# Patient Record
Sex: Female | Born: 1995 | Race: Black or African American | Hispanic: No | Marital: Single | Smoking: Current every day smoker
Health system: Southern US, Community
[De-identification: ages and names within clinical notes are randomized; demographics above are authoritative.]

## PROBLEM LIST (undated history)

## (undated) DIAGNOSIS — J45909 Unspecified asthma, uncomplicated: Secondary | ICD-10-CM

## (undated) DIAGNOSIS — M419 Scoliosis, unspecified: Secondary | ICD-10-CM

---
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
## 2013-02-10 ENCOUNTER — Emergency Department: Payer: Self-pay | Admitting: Emergency Medicine

## 2014-01-04 ENCOUNTER — Encounter: Payer: Self-pay | Admitting: Family Medicine

## 2014-01-21 ENCOUNTER — Encounter: Payer: Self-pay | Admitting: Family Medicine

## 2014-12-02 ENCOUNTER — Emergency Department: Payer: Self-pay | Admitting: Student

## 2015-11-07 ENCOUNTER — Emergency Department
Admission: EM | Admit: 2015-11-07 | Discharge: 2015-11-07 | Disposition: A | Discharge status: Home / Self Care | Payer: Self-pay | Attending: Emergency Medicine | Admitting: Emergency Medicine

## 2015-11-07 DIAGNOSIS — K5900 Constipation, unspecified: Secondary | ICD-10-CM | POA: Insufficient documentation

## 2015-11-07 DIAGNOSIS — Z3202 Encounter for pregnancy test, result negative: Secondary | ICD-10-CM | POA: Insufficient documentation

## 2015-11-07 DIAGNOSIS — R1084 Generalized abdominal pain: Secondary | ICD-10-CM

## 2015-11-07 LAB — URINALYSIS COMPLETE WITH MICROSCOPIC (ARMC ONLY)
Bilirubin Urine: NEGATIVE
Glucose, UA: NEGATIVE mg/dL
Hgb urine dipstick: NEGATIVE
KETONES UR: NEGATIVE mg/dL
NITRITE: NEGATIVE
PH: 7 (ref 5.0–8.0)
PROTEIN: NEGATIVE mg/dL
SPECIFIC GRAVITY, URINE: 1.019 (ref 1.005–1.030)

## 2015-11-07 LAB — BASIC METABOLIC PANEL
Anion gap: 6 (ref 5–15)
BUN: 10 mg/dL (ref 6–20)
CALCIUM: 9.5 mg/dL (ref 8.9–10.3)
CO2: 28 mmol/L (ref 22–32)
Chloride: 107 mmol/L (ref 101–111)
Creatinine, Ser: 0.7 mg/dL (ref 0.44–1.00)
GFR calc Af Amer: >60 mL/min (ref >60)
GLUCOSE: 76 mg/dL (ref 65–99)
Potassium: 3.3 mmol/L — ABNORMAL LOW (ref 3.5–5.1)
Sodium: 141 mmol/L (ref 135–145)

## 2015-11-07 LAB — CBC
HEMATOCRIT: 38.9 % (ref 35.0–47.0)
Hemoglobin: 13 g/dL (ref 12.0–16.0)
MCH: 29.6 pg (ref 26.0–34.0)
MCHC: 33.3 g/dL (ref 32.0–36.0)
MCV: 88.7 fL (ref 80.0–100.0)
PLATELETS: 346 10*3/uL (ref 150–440)
RBC: 4.39 MIL/uL (ref 3.80–5.20)
RDW: 15.2 % — AB (ref 11.5–14.5)
WBC: 6.9 10*3/uL (ref 3.6–11.0)

## 2015-11-07 LAB — HCG, QUANTITATIVE, PREGNANCY: hCG, Beta Chain, Quant, S: <1 m[IU]/mL (ref <5)

## 2015-11-07 NOTE — ED Provider Notes (Signed)
[facility] Emergency Department Provider Note  Time seen: 9:22 PM  I have reviewed the triage vital signs and the nursing notes.   HISTORY  Chief Complaint Back Pain    HPI Leslie Banks is a 20 y.o. female with no past medical history presents the emergency department with abdominal discomfort. According to the patient for the past 3 months she feels her abdomen has enlarged in size, she feels moving occasionally feels kicking in her abdomen. She states she has taken pregnancy tests at home which he been negative, but it feels like a pregnancy so she came to the emergency department for evaluation. Denies any "pain." They she does have discomfort in a sensation that something is kicking in her abdomen most days. Denies any dysuria. Denies any back pain. Denies any nausea, vomiting, diarrhea or fever.     No past medical history on file.  There are no active problems to display for this patient.   No past surgical history on file.  No current outpatient prescriptions on file.  Allergies Review of patient's allergies indicates no known allergies.  No family history on file.  Social History Social History  Substance Use Topics  . Smoking status: Not on file  . Smokeless tobacco: Not on file  . Alcohol Use: Not on file    Review of Systems Constitutional: Negative for fever. Cardiovascular: Negative for chest pain. Respiratory: Negative for shortness of breath. Gastrointestinal: Negative for abdominal pain Genitourinary: Negative for dysuria. Neurological: Negative for headache 10-point ROS otherwise negative.  ____________________________________________   PHYSICAL EXAM:  VITAL SIGNS: ED Triage Vitals  Enc Vitals Group     BP 11/07/15 2025 106/64 mmHg     Pulse Rate 11/07/15 2025 98     Resp 11/07/15 2025 18     Temp 11/07/15 2025 98.4 F (36.9 C)     Temp Source 11/07/15 2025 Oral     SpO2 11/07/15 2025 100 %     Weight  11/07/15 2025 130 lb (58.968 kg)     Height --      Head Cir --      Peak Flow --      Pain Score 11/07/15 2025 0     Pain Loc --      Pain Edu? --      Excl. in GC? --     Constitutional: Alert and oriented. Well appearing and in no distress. Eyes: Normal exam ENT   Head: Normocephalic and atraumatic.   Mouth/Throat: Mucous membranes are moist. Cardiovascular: Normal rate, regular rhythm. No murmur Respiratory: Normal respiratory effort without tachypnea nor retractions. Breath sounds are clear Gastrointestinal: Soft, minimal left lower quadrant tenderness palpation. No rebound or guarding. No distention. Musculoskeletal: Nontender with normal range of motion in all extremities.  Neurologic:  Normal speech and language. No gross focal neurologic deficits Skin:  Skin is warm, dry and intact.  Psychiatric: Mood and affect are normal. Speech and behavior are normal.  ____________________________________________   INITIAL IMPRESSION / ASSESSMENT AND PLAN / ED COURSE  Pertinent labs & imaging results that were available during my care of the patient were reviewed by me and considered in my medical decision making (see chart for details).  Patient appears very well, abdomen appears normal, minimal left lower quadrant tenderness to palpation. No rebound or guarding. No CVA tenderness. No back tenderness. Bedside ultrasound performed by myself appears to show a normal uterus, no signs of pregnancy. Normal aorta. Normal gallbladder. No free fluid. We  will check labs including a quantitative beta hCG to confirm. Patient is in no distress and appears very well overall.  Labs are normal including negative beta hCG. We'll discharge the patient. She does state she feels somewhat constipated, this could be the cause of the patient's discomfort.  ____________________________________________   FINAL CLINICAL IMPRESSION(S) / ED DIAGNOSES  Abdominal discomfort   Minna Antis,  MD 11/07/15 2231

## 2015-11-07 NOTE — ED Notes (Signed)
FHT negative

## 2015-11-07 NOTE — ED Notes (Signed)
Pt here because abd is distended and unsure of pregnancy, no other complaints

## 2015-11-07 NOTE — ED Notes (Signed)
MD at bedside. 

## 2015-11-07 NOTE — Discharge Instructions (Signed)

## 2015-11-07 NOTE — ED Notes (Signed)
Pt states x64months she has been feeling abd "moving and thumps". Denies any positive preg test. Pt also states she is having back spasms . MD at bedside with ultrasound

## 2016-01-13 ENCOUNTER — Emergency Department
Admission: EM | Admit: 2016-01-13 | Discharge: 2016-01-13 | Disposition: A | Discharge status: Home / Self Care | Payer: Self-pay | Attending: Emergency Medicine | Admitting: Emergency Medicine

## 2016-01-13 ENCOUNTER — Encounter: Payer: Self-pay | Admitting: Emergency Medicine

## 2016-01-13 DIAGNOSIS — F17210 Nicotine dependence, cigarettes, uncomplicated: Secondary | ICD-10-CM | POA: Insufficient documentation

## 2016-01-13 DIAGNOSIS — Y929 Unspecified place or not applicable: Secondary | ICD-10-CM | POA: Insufficient documentation

## 2016-01-13 DIAGNOSIS — W268XXA Contact with other sharp object(s), not elsewhere classified, initial encounter: Secondary | ICD-10-CM | POA: Insufficient documentation

## 2016-01-13 DIAGNOSIS — Y93G3 Activity, cooking and baking: Secondary | ICD-10-CM | POA: Insufficient documentation

## 2016-01-13 DIAGNOSIS — S61411A Laceration without foreign body of right hand, initial encounter: Secondary | ICD-10-CM | POA: Insufficient documentation

## 2016-01-13 DIAGNOSIS — Y999 Unspecified external cause status: Secondary | ICD-10-CM | POA: Insufficient documentation

## 2016-01-13 DIAGNOSIS — J45909 Unspecified asthma, uncomplicated: Secondary | ICD-10-CM | POA: Insufficient documentation

## 2016-01-13 HISTORY — DX: Scoliosis, unspecified: M41.9

## 2016-01-13 HISTORY — DX: Unspecified asthma, uncomplicated: J45.909

## 2016-01-13 MED ORDER — IBUPROFEN 800 MG PO TABS
800.0000 mg | ORAL_TABLET | Freq: Once | ORAL | Status: AC
  Administered 2016-01-13: 800 mg via ORAL

## 2016-01-13 MED ORDER — TETANUS-DIPHTH-ACELL PERTUSSIS 5-2.5-18.5 LF-MCG/0.5 IM SUSP
0.5000 mL | Freq: Once | INTRAMUSCULAR | Status: AC
  Administered 2016-01-13: 0.5 mL via INTRAMUSCULAR
  Filled 2016-01-13: qty 0.5

## 2016-01-13 MED ORDER — LIDOCAINE HCL (PF) 1 % IJ SOLN
2.0000 mL | Freq: Once | INTRAMUSCULAR | Status: AC
  Administered 2016-01-13: 2 mL
  Filled 2016-01-13: qty 5

## 2016-01-13 MED ORDER — IBUPROFEN 800 MG PO TABS
ORAL_TABLET | ORAL | Status: AC
Start: 2016-01-13 — End: 2016-01-13
  Administered 2016-01-13: 800 mg via ORAL
  Filled 2016-01-13: qty 1

## 2016-01-13 NOTE — ED Provider Notes (Signed)
CSN: [id number]     Arrival date & time 01/13/16  1957 History   First MD Initiated Contact with Patient 01/13/16 2024     Chief Complaint  Patient presents with  . Extremity Laceration     (Consider location/radiation/quality/duration/timing/severity/associated sxs/prior Treatment) HPI  20 year old female presents today for laceration to the right thumb along the thenar eminence. Patient states she was cooking, cut her right hand on a can. Tetanus is not up-to-date. Injury occurred at approximate 5 PM tonight. She denies any numbness or tingling. Bleeding is well controlled. Pain is moderate. She has not taken any medications for pain.  Past Medical History  Diagnosis Date  . Asthma   . Scoliosis    History reviewed. No pertinent past surgical history. History reviewed. No pertinent family history. Social History  Substance Use Topics  . Smoking status: Current Every Day Smoker    Types: Cigarettes  . Smokeless tobacco: None  . Alcohol Use: No   OB History    No data available     Review of Systems  Constitutional: Negative for fever, chills, activity change and fatigue.  HENT: Negative for congestion, sinus pressure and sore throat.   Eyes: Negative for visual disturbance.  Respiratory: Negative for cough, chest tightness and shortness of breath.   Cardiovascular: Negative for chest pain and leg swelling.  Gastrointestinal: Negative for nausea, vomiting, abdominal pain and diarrhea.  Genitourinary: Negative for dysuria.  Musculoskeletal: Negative for arthralgias and gait problem.  Skin: Positive for wound. Negative for rash.  Neurological: Negative for weakness, numbness and headaches.  Hematological: Negative for adenopathy.  Psychiatric/Behavioral: Negative for behavioral problems, confusion and agitation.      Allergies  Review of patient's allergies indicates no known allergies.  Home Medications   Prior to Admission medications   Not on File   BP 109/67  mmHg  Pulse 101  Temp(Src) 99 F (37.2 C) (Oral)  Resp 18  SpO2 98%  LMP 09/14/2015 (Approximate) Physical Exam  Constitutional: She is oriented to person, place, and time. She appears well-developed and well-nourished. No distress.  HENT:  Head: Normocephalic and atraumatic.  Mouth/Throat: Oropharynx is clear and moist.  Eyes: EOM are normal. Pupils are equal, round, and reactive to light. Right eye exhibits no discharge. Left eye exhibits no discharge.  Neck: Normal range of motion. Neck supple.  Cardiovascular: Normal rate, regular rhythm and intact distal pulses.   Pulmonary/Chest: Effort normal. No respiratory distress.  Musculoskeletal: Normal range of motion. She exhibits no edema.  Examination of the right hand shows patient has full composite fist. There is mild superficial to similar laceration along the thenar eminence. No numbness or tingling. Sensation is intact. Wound appears to be clean  Neurological: She is alert and oriented to person, place, and time. She has normal reflexes.  Skin: Skin is warm and dry.  Psychiatric: She has a normal mood and affect. Her behavior is normal. Thought content normal.    ED Course  Procedures (including critical care time) LACERATION REPAIR Performed by: Patience Musca Authorized by: Patience Musca Consent: Verbal consent obtained. Risks and benefits: risks, benefits and alternatives were discussed Consent given by: patient Patient identity confirmed: provided demographic data Prepped and Draped in normal sterile fashion Wound explored  Laceration Location: Right hand  Laceration Length: 2 cm  No Foreign Bodies seen or palpated  Anesthesia: local infiltration  Local anesthetic: lidocaine 1% % without epinephrine  Anesthetic total: 2 ml  Irrigation method: syringe Amount of cleaning:  standard  Skin closure: Simple interrupted 4-0 nylon   Number of sutures: 3   Technique: Simple interrupted    Patient tolerance: Patient tolerated the procedure well with no immediate complications. Labs Review Labs Reviewed - No data to display  Imaging Review No results found. I have personally reviewed and evaluated these images and lab results as part of my medical decision-making.   EKG Interpretation None      MDM   Final diagnoses:  Hand laceration, right, initial encounter    20 year old female with right hand laceration. Lacerations linear and clean. Laceration was approximated with 3 4-0 nylon sutures. Tetanus is updated. Patient will follow-up in 8-10 days for suture removal.    Evon Slackhomas C Dylann Layne, PA-C 01/13/16 2049  Phineas SemenGraydon Goodman, MD 01/13/16 2236

## 2016-01-13 NOTE — Discharge Instructions (Signed)
Laceration Care, Adult  A laceration is a cut that goes through all layers of the skin. The cut also goes into the tissue that is right under the skin. Some cuts heal on their own. Others need to be closed with stitches (sutures), staples, skin adhesive strips, or wound glue. Taking care of your cut lowers your risk of infection and helps your cut to heal better.  HOW TO TAKE CARE OF YOUR CUT  For stitches or staples:  · Keep the wound clean and dry.  · If you were given a bandage (dressing), you should change it at least one time per day or as told by your doctor. You should also change it if it gets wet or dirty.  · Keep the wound completely dry for the first 24 hours or as told by your doctor. After that time, you may take a shower or a bath. However, make sure that the wound is not soaked in water until after the stitches or staples have been removed.  · Clean the wound one time each day or as told by your doctor:    Wash the wound with soap and water.    Rinse the wound with water until all of the soap comes off.    Pat the wound dry with a clean towel. Do not rub the wound.  · After you clean the wound, put a thin layer of antibiotic ointment on it as told by your doctor. This ointment:    Helps to prevent infection.    Keeps the bandage from sticking to the wound.  · Have your stitches or staples removed as told by your doctor.  If your doctor used skin adhesive strips:   · Keep the wound clean and dry.  · If you were given a bandage, you should change it at least one time per day or as told by your doctor. You should also change it if it gets dirty or wet.  · Do not get the skin adhesive strips wet. You can take a shower or a bath, but be careful to keep the wound dry.  · If the wound gets wet, pat it dry with a clean towel. Do not rub the wound.  · Skin adhesive strips fall off on their own. You can trim the strips as the wound heals. Do not remove any strips that are still stuck to the wound. They will  fall off after a while.  If your doctor used wound glue:  · Try to keep your wound dry, but you may briefly wet it in the shower or bath. Do not soak the wound in water, such as by swimming.  · After you take a shower or a bath, gently pat the wound dry with a clean towel. Do not rub the wound.  · Do not do any activities that will make you really sweaty until the skin glue has fallen off on its own.  · Do not apply liquid, cream, or ointment medicine to your wound while the skin glue is still on.  · If you were given a bandage, you should change it at least one time per day or as told by your doctor. You should also change it if it gets dirty or wet.  · If a bandage is placed over the wound, do not let the tape for the bandage touch the skin glue.  · Do not pick at the glue. The skin glue usually stays on for 5-10 days. Then, it   falls off of the skin.  General Instructions   · To help prevent scarring, make sure to cover your wound with sunscreen whenever you are outside after stitches are removed, after adhesive strips are removed, or when wound glue stays in place and the wound is healed. Make sure to wear a sunscreen of at least 30 SPF.  · Take over-the-counter and prescription medicines only as told by your doctor.  · If you were given antibiotic medicine or ointment, take or apply it as told by your doctor. Do not stop using the antibiotic even if your wound is getting better.  · Do not scratch or pick at the wound.  · Keep all follow-up visits as told by your doctor. This is important.  · Check your wound every day for signs of infection. Watch for:    Redness, swelling, or pain.    Fluid, blood, or pus.  · Raise (elevate) the injured area above the level of your heart while you are sitting or lying down, if possible.  GET HELP IF:  · You got a tetanus shot and you have any of these problems at the injection site:    Swelling.    Very bad pain.    Redness.    Bleeding.  · You have a fever.  · A wound that was  closed breaks open.  · You notice a bad smell coming from your wound or your bandage.  · You notice something coming out of the wound, such as wood or glass.  · Medicine does not help your pain.  · You have more redness, swelling, or pain at the site of your wound.  · You have fluid, blood, or pus coming from your wound.  · You notice a change in the color of your skin near your wound.  · You need to change the bandage often because fluid, blood, or pus is coming from the wound.  · You start to have a new rash.  · You start to have numbness around the wound.  GET HELP RIGHT AWAY IF:  · You have very bad swelling around the wound.  · Your pain suddenly gets worse and is very bad.  · You notice painful lumps near the wound or on skin that is anywhere on your body.  · You have a red streak going away from your wound.  · The wound is on your hand or foot and you cannot move a finger or toe like you usually can.  · The wound is on your hand or foot and you notice that your fingers or toes look pale or bluish.     This information is not intended to replace advice given to you by your health care provider. Make sure you discuss any questions you have with your health care provider.     Document Released: 02/26/2008 Document Revised: 01/24/2015 Document Reviewed: 09/05/2014  Elsevier Interactive Patient Education ©2016 [employer]

## 2016-01-13 NOTE — ED Notes (Signed)
Pt to ED via POV from home c/o right thumb laceration that happened around 1700 today.  Pt states cut thumb on a can while cooking.  Bleeding under control at this time.  Laceration approx 1 in.

## 2016-04-21 ENCOUNTER — Emergency Department
Admission: EM | Admit: 2016-04-21 | Discharge: 2016-04-21 | Disposition: A | Discharge status: Home / Self Care | Payer: Self-pay | Attending: Emergency Medicine | Admitting: Emergency Medicine

## 2016-04-21 ENCOUNTER — Encounter: Payer: Self-pay | Admitting: Emergency Medicine

## 2016-04-21 ENCOUNTER — Emergency Department: Payer: Self-pay

## 2016-04-21 DIAGNOSIS — F17210 Nicotine dependence, cigarettes, uncomplicated: Secondary | ICD-10-CM | POA: Insufficient documentation

## 2016-04-21 DIAGNOSIS — J40 Bronchitis, not specified as acute or chronic: Secondary | ICD-10-CM | POA: Insufficient documentation

## 2016-04-21 DIAGNOSIS — Z8709 Personal history of other diseases of the respiratory system: Secondary | ICD-10-CM

## 2016-04-21 DIAGNOSIS — J069 Acute upper respiratory infection, unspecified: Secondary | ICD-10-CM | POA: Insufficient documentation

## 2016-04-21 LAB — POCT RAPID STREP A: STREPTOCOCCUS, GROUP A SCREEN (DIRECT): NEGATIVE

## 2016-04-21 MED ORDER — AZITHROMYCIN 250 MG PO TABS: ORAL_TABLET | ORAL | 0 refills | Status: DC

## 2016-04-21 MED ORDER — PSEUDOEPH-BROMPHEN-DM 30-2-10 MG/5ML PO SYRP: 10.0000 mL | ORAL_SOLUTION | Freq: Four times a day (QID) | ORAL | 0 refills | Status: DC | PRN

## 2016-04-21 MED ORDER — ALBUTEROL SULFATE (2.5 MG/3ML) 0.083% IN NEBU
2.5000 mg | INHALATION_SOLUTION | Freq: Once | RESPIRATORY_TRACT | Status: AC
  Administered 2016-04-21: 2.5 mg via RESPIRATORY_TRACT
  Filled 2016-04-21: qty 3

## 2016-04-21 MED ORDER — ALBUTEROL SULFATE HFA 108 (90 BASE) MCG/ACT IN AERS: 1.0000 | INHALATION_SPRAY | Freq: Four times a day (QID) | RESPIRATORY_TRACT | 0 refills | Status: AC | PRN

## 2016-04-21 NOTE — ED Provider Notes (Signed)
[facility] Emergency Department Provider Note  ____________________________________________  Time seen: Approximately 11:28 AM  I have reviewed the triage vital signs and the nursing notes.   HISTORY  Chief Complaint Cough and Sore Throat    HPI Leslie Banks is a 20 y.o. female , NAD, presents to the emergency department with 1 week history of cough and sore throat. Patient states had mild sore throat approximately one week ago that has continued to persist over the last week. Notes cough with chest congestion worsening over the last few days. Does have a history of asthma but does not have a rescue inhaler to use. Also notes nasal congestion, ear pressure, sinus pressure. Has had fevers and chills with MAXIMUM TEMPERATURE of 44F yesterday. Has not been taking anything over-the-counter for current symptoms. Denies any sick contacts. Has not had any chest pain, wheezing but does note shortness of breath with cough. Has been eating and drinking well. Has not had any abdominal pain, nausea, vomiting, diarrhea.   Past Medical History:  Diagnosis Date  . Asthma   . Scoliosis     There are no active problems to display for this patient.   History reviewed. No pertinent surgical history.  Prior to Admission medications   Medication Sig Start Date End Date Taking? Authorizing Provider  albuterol (PROVENTIL HFA;VENTOLIN HFA) 108 (90 Base) MCG/ACT inhaler Inhale 1-2 puffs into the lungs every 6 (six) hours as needed for wheezing or shortness of breath. 04/21/16   Jami L Hagler, PA-C  azithromycin (ZITHROMAX Z-PAK) 250 MG tablet Take 2 tablets (500 mg) on  Day 1,  followed by 1 tablet (250 mg) once daily on Days 2 through 5. 04/21/16   Jami L Hagler, PA-C  brompheniramine-pseudoephedrine-DM 30-2-10 MG/5ML syrup Take 10 mLs by mouth 4 (four) times daily as needed. 04/21/16   Jami L Hagler, PA-C    Allergies Review of patient's allergies indicates no known  allergies.  History reviewed. No pertinent family history.  Social History Social History  Substance Use Topics  . Smoking status: Current Every Day Smoker    Types: Cigarettes  . Smokeless tobacco: Never Used  . Alcohol use No     Review of Systems  Constitutional: Positive fever, chills, fatigue. Eyes: No visual changes. No discharge, Redness, pain ENT: Positive sore throat, nasal congestion, sinus pressure, ear pressure. Cardiovascular: No chest pain, palpitations. Respiratory: Positive chest congestion, cough with shortness of breath. No wheezing.  Gastrointestinal: No abdominal pain.  No nausea, vomiting.   Musculoskeletal: Positive general myalgias. Negative for back, neck pain.  Skin: Negative for rash. Neurological: Negative for headaches, focal weakness or numbness. 10-point ROS otherwise negative.  ____________________________________________   PHYSICAL EXAM:  VITAL SIGNS: ED Triage Vitals  Enc Vitals Group     BP 04/21/16 1109 (!) 99/53     Pulse Rate 04/21/16 1109 (!) 108     Resp 04/21/16 1109 18     Temp 04/21/16 1109 98.5 F (36.9 C)     Temp Source 04/21/16 1109 Oral     SpO2 04/21/16 1109 100 %     Weight 04/21/16 1109 136 lb (61.7 kg)     Height 04/21/16 1109 5\' 4"  (1.626 m)     Head Circumference --      Peak Flow --      Pain Score 04/21/16 1110 8     Pain Loc --      Pain Edu? --      Excl. in GC? --  Constitutional: Alert and oriented. Ill appearing but in no acute distress. Eyes: Conjunctivae are normal without injection or icterus Head: Atraumatic. ENT:      Ears: TMs visualized bilaterally without effusion, bulging, perforation, erythema.      Nose: Moderate congestion with moderate clear rhinnorhea. Bilateral turbinates with injection.      Mouth/Throat: Mucous membranes are moist. Bilateral tonsils with moderate injection, mild swelling but no exudate. Posterior pharynx without erythema, swelling, exudate. Uvula is  midline. Neck: Supple with full range of motion. Trachea midline Hematological/Lymphatic/Immunilogical: No cervical lymphadenopathy. Cardiovascular: Normal rate, regular rhythm. Normal S1 and S2.  No murmurs, rubs, gallops Good peripheral circulation. Respiratory: Normal respiratory effort without tachypnea or retractions. Lungs CTAB with breath sounds noted in all lung fields. No wheeze, rhonchi, rales. Neurologic:  Normal speech and language. No gross focal neurologic deficits are appreciated.  Skin:  Skin is warm, dry and intact. No rash noted. Psychiatric: Mood and affect are normal. Speech and behavior are normal. Patient exhibits appropriate insight and judgement.   ____________________________________________   LABS (all labs ordered are listed, but only abnormal results are displayed)  Labs Reviewed  POCT RAPID STREP A   ____________________________________________  EKG  None ____________________________________________  RADIOLOGY I have personally viewed and evaluated these images (plain radiographs) as part of my medical decision making, as well as reviewing the written report by the radiologist.  Dg Chest 2 View  Result Date: 04/21/2016 CLINICAL DATA:  Cough for 1 week, fever. EXAM: CHEST  2 VIEW COMPARISON:  02/11/2013 FINDINGS: The heart size and mediastinal contours are within normal limits. Both lungs are clear. The visualized skeletal structures are unremarkable. IMPRESSION: No active cardiopulmonary disease. Electronically Signed   By: Charlett Nose M.D.   On: 04/21/2016 12:02   ____________________________________________    PROCEDURES  Procedure(s) performed: None   Procedures   Medications  albuterol (PROVENTIL) (2.5 MG/3ML) 0.083% nebulizer solution 2.5 mg (2.5 mg Nebulization Given 04/21/16 1203)   ----------------------------------------- 12:33 PM on 04/21/2016 -----------------------------------------  Patient notes significant improvement of  chest congestion since being given albuterol nebulized solution.  ____________________________________________   INITIAL IMPRESSION / ASSESSMENT AND PLAN / ED COURSE  Pertinent labs & imaging results that were available during my care of the patient were reviewed by me and considered in my medical decision making (see chart for details).  Clinical Course    Patient's diagnosis is consistent with bronchitis, upper respiratory infection with history of asthma. Patient will be discharged home with prescriptions for Zithromax, albuterol and Bromfed-DM cough syrup to take as directed. Patient advised to take over-the-counter Tylenol or ibuprofen as needed for fever or aches. Patient is to follow up with her primary care provider at [facility] if symptoms persist past this treatment course. Patient is given ED precautions to return to the ED for any worsening or new symptoms.    ____________________________________________  FINAL CLINICAL IMPRESSION(S) / ED DIAGNOSES  Final diagnoses:  Bronchitis  URI (upper respiratory infection)  History of asthma      NEW MEDICATIONS STARTED DURING THIS VISIT:  Discharge Medication List as of 04/21/2016 12:35 PM    START taking these medications   Details  albuterol (PROVENTIL HFA;VENTOLIN HFA) 108 (90 Base) MCG/ACT inhaler Inhale 1-2 puffs into the lungs every 6 (six) hours as needed for wheezing or shortness of breath., Starting Sun 04/21/2016, Print    azithromycin (ZITHROMAX Z-PAK) 250 MG tablet Take 2 tablets (500 mg) on  Day 1,  followed by 1  tablet (250 mg) once daily on Days 2 through 5., Print    brompheniramine-pseudoephedrine-DM 30-2-10 MG/5ML syrup Take 10 mLs by mouth 4 (four) times daily as needed., Starting Sun 04/21/2016, Print             Hope Pigeon, PA-C 04/21/16 1248    Minna Antis, MD 04/21/16 [phone]

## 2016-04-21 NOTE — ED Triage Notes (Signed)
Pt presents to ED with c/o "I don't feel good". Pt states fever of 99.8 at home, states cough and sore throat x 1 week.

## 2016-04-21 NOTE — ED Notes (Signed)
See triage note  States she "doesn't "feel good  Weakness, sore throat and low grade fever yesterday  Afebrile on arrival   Also has had cough for about 1 week

## 2016-04-21 NOTE — ED Notes (Signed)
Pt informed to return if any life threatening symptoms occur.  

## 2016-07-01 ENCOUNTER — Emergency Department
Admission: EM | Admit: 2016-07-01 | Discharge: 2016-07-01 | Disposition: A | Discharge status: Home / Self Care | Payer: Self-pay | Attending: Emergency Medicine | Admitting: Emergency Medicine

## 2016-07-01 DIAGNOSIS — O209 Hemorrhage in early pregnancy, unspecified: Secondary | ICD-10-CM | POA: Insufficient documentation

## 2016-07-01 DIAGNOSIS — J45909 Unspecified asthma, uncomplicated: Secondary | ICD-10-CM | POA: Insufficient documentation

## 2016-07-01 DIAGNOSIS — F17210 Nicotine dependence, cigarettes, uncomplicated: Secondary | ICD-10-CM | POA: Insufficient documentation

## 2016-07-01 DIAGNOSIS — Z5321 Procedure and treatment not carried out due to patient leaving prior to being seen by health care provider: Secondary | ICD-10-CM | POA: Insufficient documentation

## 2016-07-01 DIAGNOSIS — O99330 Smoking (tobacco) complicating pregnancy, unspecified trimester: Secondary | ICD-10-CM | POA: Insufficient documentation

## 2016-07-01 LAB — HCG, QUANTITATIVE, PREGNANCY

## 2016-07-01 LAB — ABO/RH: ABO/RH(D): B POS

## 2016-07-01 NOTE — ED Triage Notes (Signed)
Abdominal cramping and vaginal bleeding. Positive pregnancy test last month. No prenatal care established.

## 2016-07-01 NOTE — ED Notes (Signed)
Pt called from waiting room x 3 with no response.

## 2016-07-03 ENCOUNTER — Encounter: Payer: Self-pay | Admitting: Emergency Medicine

## 2016-07-03 ENCOUNTER — Emergency Department
Admission: EM | Admit: 2016-07-03 | Discharge: 2016-07-03 | Disposition: A | Discharge status: Home / Self Care | Payer: Self-pay | Attending: Emergency Medicine | Admitting: Emergency Medicine

## 2016-07-03 ENCOUNTER — Emergency Department: Payer: Self-pay

## 2016-07-03 DIAGNOSIS — Z79899 Other long term (current) drug therapy: Secondary | ICD-10-CM | POA: Insufficient documentation

## 2016-07-03 DIAGNOSIS — J45909 Unspecified asthma, uncomplicated: Secondary | ICD-10-CM | POA: Insufficient documentation

## 2016-07-03 DIAGNOSIS — A599 Trichomoniasis, unspecified: Secondary | ICD-10-CM

## 2016-07-03 DIAGNOSIS — R102 Pelvic and perineal pain unspecified side: Secondary | ICD-10-CM

## 2016-07-03 DIAGNOSIS — N83209 Unspecified ovarian cyst, unspecified side: Secondary | ICD-10-CM

## 2016-07-03 DIAGNOSIS — N83201 Unspecified ovarian cyst, right side: Secondary | ICD-10-CM | POA: Insufficient documentation

## 2016-07-03 DIAGNOSIS — F17210 Nicotine dependence, cigarettes, uncomplicated: Secondary | ICD-10-CM | POA: Insufficient documentation

## 2016-07-03 DIAGNOSIS — N739 Female pelvic inflammatory disease, unspecified: Secondary | ICD-10-CM

## 2016-07-03 DIAGNOSIS — Z91010 Allergy to peanuts: Secondary | ICD-10-CM | POA: Insufficient documentation

## 2016-07-03 LAB — LIPASE, BLOOD: Lipase: 17 U/L (ref 11–51)

## 2016-07-03 LAB — URINALYSIS COMPLETE WITH MICROSCOPIC (ARMC ONLY)
BACTERIA UA: NONE SEEN
Bilirubin Urine: NEGATIVE
GLUCOSE, UA: NEGATIVE mg/dL
Ketones, ur: NEGATIVE mg/dL
Leukocytes, UA: NEGATIVE
Nitrite: NEGATIVE
Protein, ur: NEGATIVE mg/dL
Specific Gravity, Urine: 1.008 (ref 1.005–1.030)
pH: 8 (ref 5.0–8.0)

## 2016-07-03 LAB — CBC
HEMATOCRIT: 37.3 % (ref 35.0–47.0)
Hemoglobin: 12.3 g/dL (ref 12.0–16.0)
MCH: 28.5 pg (ref 26.0–34.0)
MCHC: 33 g/dL (ref 32.0–36.0)
MCV: 86.4 fL (ref 80.0–100.0)
Platelets: 312 10*3/uL (ref 150–440)
RBC: 4.32 MIL/uL (ref 3.80–5.20)
RDW: 16.1 % — AB (ref 11.5–14.5)
WBC: 4.9 10*3/uL (ref 3.6–11.0)

## 2016-07-03 LAB — CHLAMYDIA/NGC RT PCR (ARMC ONLY)
Chlamydia Tr: NOT DETECTED
N gonorrhoeae: NOT DETECTED

## 2016-07-03 LAB — WET PREP, GENITAL
SPERM: NONE SEEN
YEAST WET PREP: NONE SEEN

## 2016-07-03 LAB — HCG, QUANTITATIVE, PREGNANCY: hCG, Beta Chain, Quant, S: <1 m[IU]/mL (ref <5)

## 2016-07-03 LAB — COMPREHENSIVE METABOLIC PANEL
ALBUMIN: 3.9 g/dL (ref 3.5–5.0)
ALT: 13 U/L — AB (ref 14–54)
AST: 23 U/L (ref 15–41)
Alkaline Phosphatase: 48 U/L (ref 38–126)
Anion gap: 6 (ref 5–15)
BILIRUBIN TOTAL: 0.2 mg/dL — AB (ref 0.3–1.2)
BUN: 5 mg/dL — AB (ref 6–20)
CO2: 24 mmol/L (ref 22–32)
CREATININE: 0.73 mg/dL (ref 0.44–1.00)
Calcium: 9.4 mg/dL (ref 8.9–10.3)
Chloride: 108 mmol/L (ref 101–111)
GFR calc Af Amer: >60 mL/min (ref >60)
GFR calc non Af Amer: >60 mL/min (ref >60)
GLUCOSE: 109 mg/dL — AB (ref 65–99)
POTASSIUM: 3.5 mmol/L (ref 3.5–5.1)
Sodium: 138 mmol/L (ref 135–145)
TOTAL PROTEIN: 7.1 g/dL (ref 6.5–8.1)

## 2016-07-03 MED ORDER — DOXYCYCLINE HYCLATE 100 MG PO TABS: 100.0000 mg | ORAL_TABLET | Freq: Two times a day (BID) | ORAL | 0 refills | Status: DC

## 2016-07-03 MED ORDER — CEFTRIAXONE SODIUM 250 MG IJ SOLR
250.0000 mg | Freq: Once | INTRAMUSCULAR | Status: AC
  Administered 2016-07-03: 250 mg via INTRAMUSCULAR

## 2016-07-03 MED ORDER — METRONIDAZOLE 500 MG PO TABS
500.0000 mg | ORAL_TABLET | Freq: Once | ORAL | Status: AC
  Administered 2016-07-03: 500 mg via ORAL

## 2016-07-03 MED ORDER — DOXYCYCLINE HYCLATE 100 MG PO TABS
100.0000 mg | ORAL_TABLET | Freq: Once | ORAL | Status: AC
Start: 2016-07-03 — End: 2016-07-03
  Administered 2016-07-03: 100 mg via ORAL

## 2016-07-03 MED ORDER — DOXYCYCLINE HYCLATE 100 MG PO TABS
ORAL_TABLET | ORAL | Status: AC
  Administered 2016-07-03: 100 mg via ORAL
  Filled 2016-07-03: qty 1

## 2016-07-03 MED ORDER — METRONIDAZOLE 500 MG PO TABS: 500.0000 mg | ORAL_TABLET | Freq: Two times a day (BID) | ORAL | 0 refills | Status: AC

## 2016-07-03 MED ORDER — METRONIDAZOLE 500 MG PO TABS
ORAL_TABLET | ORAL | Status: AC
  Administered 2016-07-03: 500 mg via ORAL
  Filled 2016-07-03: qty 1

## 2016-07-03 MED ORDER — CEFTRIAXONE SODIUM 250 MG IJ SOLR
INTRAMUSCULAR | Status: AC
  Administered 2016-07-03: 250 mg via INTRAMUSCULAR
  Filled 2016-07-03: qty 250

## 2016-07-03 NOTE — ED Provider Notes (Addendum)
[facility] Emergency Department Provider Note   ____________________________________________   First MD Initiated Contact with Patient 07/03/16 1652     (approximate)  I have reviewed the triage vital signs and the nursing notes.   HISTORY  Chief Complaint Abdominal Pain   HPI Leslie Banks is a 20 y.o. female with a history of asthma and scoliosis was presenting with bilateral lower abdominal pain over the past2 days. She says the pain radiates through to her lower back. Is concerned about pregnancy. Also with nasal congestion as well as nausea and vomiting. Denies any diarrhea. Says that she has also had clots, vaginally, over the past 2 days. Says that she has a regular period and usually spots. Also had a positive pregnancy test 6 weeks ago and is curious if she may be having complications from pregnancy. Also with positive sick contact in her "godson" who had a recent viral illness.   Past Medical History:  Diagnosis Date  . Asthma   . Scoliosis     There are no active problems to display for this patient.   History reviewed. No pertinent surgical history.  Prior to Admission medications   Medication Sig Start Date End Date Taking? Authorizing Provider  albuterol (PROVENTIL HFA;VENTOLIN HFA) 108 (90 Base) MCG/ACT inhaler Inhale 1-2 puffs into the lungs every 6 (six) hours as needed for wheezing or shortness of breath. 04/21/16   Jami L Hagler, PA-C  azithromycin (ZITHROMAX Z-PAK) 250 MG tablet Take 2 tablets (500 mg) on  Day 1,  followed by 1 tablet (250 mg) once daily on Days 2 through 5. 04/21/16   Jami L Hagler, PA-C  brompheniramine-pseudoephedrine-DM 30-2-10 MG/5ML syrup Take 10 mLs by mouth 4 (four) times daily as needed. 04/21/16   Jami L Hagler, PA-C    Allergies Peanut-containing drug products  No family history on file.  Social History Social History  Substance Use Topics  . Smoking status: Current Every Day Smoker    Types:  Cigarettes  . Smokeless tobacco: Never Used  . Alcohol use No    Review of Systems Constitutional: No fever/chills Eyes: No visual changes. ENT: No sore throat. Cardiovascular: Denies chest pain. Respiratory: Denies shortness of breath. Gastrointestinal:No diarrhea.  No constipation. Genitourinary: Negative for dysuria. Musculoskeletal: Negative for back pain. Skin: Negative for rash. Neurological: Negative for headaches, focal weakness or numbness.  10-point ROS otherwise negative.  ____________________________________________   PHYSICAL EXAM:  VITAL SIGNS: ED Triage Vitals  Enc Vitals Group     BP 07/03/16 1628 93/63     Pulse Rate 07/03/16 1628 (!) 108     Resp 07/03/16 1628 16     Temp 07/03/16 1628 98.1 F (36.7 C)     Temp Source 07/03/16 1628 Oral     SpO2 07/03/16 1628 100 %     Weight 07/03/16 1629 130 lb (59 kg)     Height 07/03/16 1629 5\' 4"  (1.626 m)     Head Circumference --      Peak Flow --      Pain Score 07/03/16 1629 6     Pain Loc --      Pain Edu? --      Excl. in GC? --     Constitutional: Alert and oriented. Well appearing and in no acute distress. Eyes: Conjunctivae are normal. PERRL. EOMI. Head: Atraumatic. Nose: Clear rhinorrhea bilaterally Mouth/Throat: Mucous membranes are moist.  Oropharynx non-erythematous. Neck: No stridor.   Cardiovascular: Normal rate, regular rhythm. Grossly normal  heart sounds.  Good peripheral circulation. Respiratory: Normal respiratory effort.  No retractions. Lungs CTAB. Gastrointestinal: Soft with mild bilateral lower abdominal tenderness as well as suprapubic tenderness to palpation. No rebound or guarding. No distention. No CVA tenderness. Genitourinary: Normal external genitalia without any lesions. Speculum exam with a small amount of maroon blood in the vagina without any pulling or active bleeding from the cervix. Bimanual exam without any CMT. No uterine or adnexal tenderness nor  masses. Musculoskeletal: No lower extremity tenderness nor edema.  No joint effusions. Neurologic:  Normal speech and language. No gross focal neurologic deficits are appreciated. No gait instability. Skin:  Skin is warm, dry and intact. No rash noted. Psychiatric: Mood and affect are normal. Speech and behavior are normal.  ____________________________________________   LABS (all labs ordered are listed, but only abnormal results are displayed)  Labs Reviewed  WET PREP, GENITAL - Abnormal; Notable for the following:       Result Value   Trich, Wet Prep PRESENT (*)    Clue Cells Wet Prep HPF POC PRESENT (*)    WBC, Wet Prep HPF POC FEW (*)    All other components within normal limits  COMPREHENSIVE METABOLIC PANEL - Abnormal; Notable for the following:    Glucose, Bld 109 (*)    BUN 5 (*)    ALT 13 (*)    Total Bilirubin 0.2 (*)    All other components within normal limits  CBC - Abnormal; Notable for the following:    RDW 16.1 (*)    All other components within normal limits  URINALYSIS COMPLETEWITH MICROSCOPIC ([facility] ONLY) - Abnormal; Notable for the following:    Color, Urine YELLOW (*)    APPearance HAZY (*)    Hgb urine dipstick 3+ (*)    Squamous Epithelial / LPF 6-30 (*)    All other components within normal limits  CHLAMYDIA/NGC RT PCR ([facility] ONLY)  HCG, QUANTITATIVE, PREGNANCY  LIPASE, BLOOD   ____________________________________________  EKG   ____________________________________________  RADIOLOGY  US Transvaginal Non-OB (Accession [id number]) (Order [id number])  Imaging  Date: 07/03/2016 Department: [facility] EMERGENCY DEPARTMENT Released By/Authorizing: Myrna Blazer, MD (auto-released)  Exam Information   Status Exam Begun  Exam Ended   Final [99] 07/03/2016 7:00 PM 07/03/2016 7:58 PM  PACS Images   Show images for US Transvaginal Non-OB  Study Result   CLINICAL DATA:  Pelvic pain for 2  days.  EXAM: TRANSABDOMINAL AND TRANSVAGINAL ULTRASOUND OF PELVIS  DOPPLER ULTRASOUND OF OVARIES  TECHNIQUE: Both transabdominal and transvaginal ultrasound examinations of the pelvis were performed. Transabdominal technique was performed for global imaging of the pelvis including uterus, ovaries, adnexal regions, and pelvic cul-de-sac.  It was necessary to proceed with endovaginal exam following the transabdominal exam to visualize the ovaries and endometrium. Color and duplex Doppler ultrasound was utilized to evaluate blood flow to the ovaries.  COMPARISON:  None.  FINDINGS: Uterus  Measurements: 6.4 x 2.1 x 3.7 cm. No fibroids or other mass visualized.  Endometrium  Thickness: 1.7 mm.  No focal abnormality visualized.  Right ovary  Measurements: 4.2 x 1.6 x 3.4 cm. Normal appearance/no adnexal mass. Small follicles are noted. Small hemorrhagic cyst measuring 1.2 x 1.0 x 1.4 cm.  Left ovary  Measurements: 4.3 x 1.6 x 2.8 cm. Normal appearance/no adnexal mass.  Pulsed Doppler evaluation of both ovaries demonstrates normal low-resistance arterial and venous waveforms.  Other findings  Small amount free pelvic fluid.  IMPRESSION: 1. Normal sonographic appearance  of the uterus. 2. 1.2 x 1.0 x 1.4 cm probable hemorrhagic cyst associate with the right ovary. 3. Small amount of free pelvic fluid.   Electronically Signed   By: Rudie Meyer M.D.   On: 07/03/2016 20:13     ____________________________________________   PROCEDURES  Procedure(s) performed:   Procedures  Critical Care performed:   ____________________________________________   INITIAL IMPRESSION / ASSESSMENT AND PLAN / ED COURSE  Pertinent labs & imaging results that were available during my care of the patient were reviewed by me and considered in my medical decision making (see chart for details).  ----------------------------------------- 9:17 PM on  07/03/2016 -----------------------------------------  Reviewed the lab as well as imaging results with the patient. She'll be treated for PID empirically despite being negative for gonorrhea and chlamydia swab. She has tenderness to the lower abdomen and says that radiates through to her back. He also knows that she must let her partner no sudden he must be tested and treated and neither must engage in any sexual intercourse until they are further tested and treated for STDs including HIV, syphilis and herpes. The patient will be following up with the [facility] where she has been seen before.   Clinical Course     ____________________________________________   FINAL CLINICAL IMPRESSION(S) / ED DIAGNOSES  Final diagnoses:  Pelvic pain in female  Pelvic pain in female   Pelvic inflammatory disease. Hemorrhagic cyst.   NEW MEDICATIONS STARTED DURING THIS VISIT:  New Prescriptions   No medications on file     Note:  This document was prepared using Dragon voice recognition software and may include unintentional dictation errors.    Myrna Blazer, MD 07/03/16 2119  Patient likely also with concomitant viral illness causing her runny nose, cough and nausea and vomiting. Prior to discharge, I reexamined her abdomen with her standing and there was no tenderness to palpation.    Myrna Blazer, MD 07/03/16 2123

## 2016-07-03 NOTE — ED Triage Notes (Signed)
Pt reports RUQ pain and LLQ pain x2 days, reprots lower back pain x3 days. Pt reports positive pregnancy test 6 weeks ago.

## 2016-07-03 NOTE — ED Notes (Signed)
Patient refused to wait 20 minutes post IM injection.  Patient states, "No way, I want to leave immediately.  I hate this place."  This RN apologized for patient feeling that she had to wait a lengthy time for discharge."

## 2016-11-16 ENCOUNTER — Emergency Department
Admission: EM | Admit: 2016-11-16 | Discharge: 2016-11-16 | Disposition: A | Discharge status: Home / Self Care | Payer: Medicaid Other | Attending: Emergency Medicine | Admitting: Emergency Medicine

## 2016-11-16 ENCOUNTER — Encounter: Payer: Self-pay | Admitting: Emergency Medicine

## 2016-11-16 DIAGNOSIS — Z3401 Encounter for supervision of normal first pregnancy, first trimester: Secondary | ICD-10-CM

## 2016-11-16 DIAGNOSIS — Z79899 Other long term (current) drug therapy: Secondary | ICD-10-CM | POA: Diagnosis not present

## 2016-11-16 DIAGNOSIS — F17210 Nicotine dependence, cigarettes, uncomplicated: Secondary | ICD-10-CM | POA: Insufficient documentation

## 2016-11-16 DIAGNOSIS — J45909 Unspecified asthma, uncomplicated: Secondary | ICD-10-CM | POA: Insufficient documentation

## 2016-11-16 DIAGNOSIS — Z3A08 8 weeks gestation of pregnancy: Secondary | ICD-10-CM | POA: Diagnosis not present

## 2016-11-16 DIAGNOSIS — O99331 Smoking (tobacco) complicating pregnancy, first trimester: Secondary | ICD-10-CM | POA: Diagnosis not present

## 2016-11-16 DIAGNOSIS — Z3201 Encounter for pregnancy test, result positive: Secondary | ICD-10-CM | POA: Diagnosis not present

## 2016-11-16 LAB — URINALYSIS, COMPLETE (UACMP) WITH MICROSCOPIC
BILIRUBIN URINE: NEGATIVE
GLUCOSE, UA: NEGATIVE mg/dL
HGB URINE DIPSTICK: NEGATIVE
KETONES UR: NEGATIVE mg/dL
LEUKOCYTES UA: NEGATIVE
NITRITE: NEGATIVE
PH: 7 (ref 5.0–8.0)
Protein, ur: NEGATIVE mg/dL
RBC / HPF: NONE SEEN RBC/hpf (ref 0–5)
Specific Gravity, Urine: 1.004 — ABNORMAL LOW (ref 1.005–1.030)

## 2016-11-16 LAB — HCG, QUANTITATIVE, PREGNANCY: HCG, BETA CHAIN, QUANT, S: 17107 m[IU]/mL — AB (ref <5)

## 2016-11-16 MED ORDER — FOLIC ACID 400 MCG PO TABS: 400.0000 ug | ORAL_TABLET | Freq: Every day | ORAL | 0 refills | Status: DC

## 2016-11-16 NOTE — ED Notes (Signed)
No ekg at this time. Can wait to see MD for further possible testing per dr Huel Cotequigley

## 2016-11-16 NOTE — ED Notes (Signed)
Pt is in no discomfort - states wants to know how far along she is. Last period in December per pt. States receives care in [place] and will find an obgyn there.

## 2016-11-16 NOTE — ED Triage Notes (Addendum)
Pt reports took home pregnancy test yesterday and is wants to know how far along she is. Has been having some reflux.  Denies pain at this time. G1P0.  No vaginal bleeding.  Denies nausea or vomiting.

## 2016-11-16 NOTE — ED Provider Notes (Signed)
[facility] Emergency Department Provider Note  ____________________________________________  Time seen: Approximately 5:16 PM  I have reviewed the triage vital signs and the nursing notes.   HISTORY  Chief Complaint pregnant    HPI Leslie Banks is a 21 y.o. female that presents to the emergency department is to know how far along in pregnancy she is. Patient states that she's had some occasional nausea but no vomiting. Patient also has occasional heartburn. Last menstrual period began on December 20. This is her first pregnancy. Patient states she has no concerns today. She states that she is only here to find out how far along she is and when her due date is. She wants to settle a disagreement between her and a friend of whether her due date is in September or October. Patient denies fever, headache, vomiting, abdominal pain, cramping, vaginal discharge, vaginal bleeding, dysuria, urgency, frequency.   Past Medical History:  Diagnosis Date  . Asthma   . Scoliosis     There are no active problems to display for this patient.   History reviewed. No pertinent surgical history.  Prior to Admission medications   Medication Sig Start Date End Date Taking? Authorizing Provider  albuterol (PROVENTIL HFA;VENTOLIN HFA) 108 (90 Base) MCG/ACT inhaler Inhale 1-2 puffs into the lungs every 6 (six) hours as needed for wheezing or shortness of breath. 04/21/16   Jami L Hagler, PA-C  azithromycin (ZITHROMAX Z-PAK) 250 MG tablet Take 2 tablets (500 mg) on  Day 1,  followed by 1 tablet (250 mg) once daily on Days 2 through 5. 04/21/16   Jami L Hagler, PA-C  brompheniramine-pseudoephedrine-DM 30-2-10 MG/5ML syrup Take 10 mLs by mouth 4 (four) times daily as needed. 04/21/16   Jami L Hagler, PA-C  doxycycline (VIBRA-TABS) 100 MG tablet Take 1 tablet (100 mg total) by mouth 2 (two) times daily. 07/03/16   Myrna Blazer, MD  folic acid (FOLVITE) 400 MCG tablet Take 1  tablet (400 mcg total) by mouth daily. 11/16/16 11/16/17  Enid Derry, PA-C    Allergies Peanut-containing drug products  History reviewed. No pertinent family history.  Social History Social History  Substance Use Topics  . Smoking status: Current Every Day Smoker    Types: Cigarettes  . Smokeless tobacco: Never Used  . Alcohol use No     Review of Systems  Constitutional: No fever/chills ENT: No upper respiratory complaints. Cardiovascular: No chest pain. Respiratory: No cough. No SOB. Gastrointestinal: No abdominal pain.  No vomiting.  Genitourinary: Negative for dysuria. Musculoskeletal: Negative for musculoskeletal pain. Skin: Negative for rash, abrasions, lacerations, ecchymosis. Neurological: Negative for headaches, numbness or tingling   ____________________________________________   PHYSICAL EXAM:  VITAL SIGNS: ED Triage Vitals  Enc Vitals Group     BP 11/16/16 1550 109/62     Pulse Rate 11/16/16 1550 (!) 125     Resp 11/16/16 1550 16     Temp 11/16/16 1550 99.6 F (37.6 C)     Temp Source 11/16/16 1550 Oral     SpO2 11/16/16 1550 100 %     Weight 11/16/16 1548 130 lb (59 kg)     Height 11/16/16 1548 5\' 4"  (1.626 m)     Head Circumference --      Peak Flow --      Pain Score --      Pain Loc --      Pain Edu? --      Excl. in GC? --    Eyes: Conjunctivae  are normal. PERRL. EOMI. Head: Atraumatic. ENT:      Ears:      Nose: No congestion/rhinnorhea.      Mouth/Throat: Mucous membranes are moist.  Neck: No stridor.  Cardiovascular: Normal rate, regular rhythm.  Good peripheral circulation. Respiratory: Normal respiratory effort without tachypnea or retractions. Lungs CTAB. Good air entry to the bases with no decreased or absent breath sounds. Gastrointestinal: Bowel sounds 4 quadrants. Soft and nontender to palpation. No guarding or rigidity. No palpable masses. No distention.  Musculoskeletal: Full range of motion to all extremities. No gross  deformities appreciated. Neurologic:  Normal speech and language. No gross focal neurologic deficits are appreciated.  Skin:  Skin is warm, dry and intact. No rash noted. ____________________________________________   LABS (all labs ordered are listed, but only abnormal results are displayed)  Labs Reviewed  HCG, QUANTITATIVE, PREGNANCY - Abnormal; Notable for the following:       Result Value   hCG, Beta Chain, Quant, S 17,107 (*)    All other components within normal limits  URINALYSIS, COMPLETE (UACMP) WITH MICROSCOPIC - Abnormal; Notable for the following:    Color, Urine STRAW (*)    APPearance CLEAR (*)    Specific Gravity, Urine 1.004 (*)    Bacteria, UA RARE (*)    Squamous Epithelial / LPF 0-5 (*)    All other components within normal limits   ____________________________________________  EKG   ____________________________________________  RADIOLOGY  No results found.  ____________________________________________    PROCEDURES  Procedure(s) performed:    Procedures    Medications - No data to display   ____________________________________________   INITIAL IMPRESSION / ASSESSMENT AND PLAN / ED COURSE  Pertinent labs & imaging results that were available during my care of the patient were reviewed by me and considered in my medical decision making (see chart for details).  Review of the [place] CSRS was performed in accordance of the [facility] prior to dispensing any controlled drugs.     Patient's diagnosis is consistent with first trimester pregnancy. HR reassessed at 110bpm. Patients heartbeat during previous ED visits has ranged from 84-108bpm. First day of LMP was Dec 20. Based on this, due date would be September 26th and patient is currently 9 weeks and 3 days. After hearing this, patient started clapping and stated that was all she wanted to know and was ready to go home. Patient denies any concerns. What prompted patient to come to the ED was a  disagreement between her and a friend of whether she was due in September or October. Heart tones were not heard on doppler but at [redacted] weeks pregnant, this is expected. Patient will be discharged home with prescriptions for folic acid. Patient is to follow up with ob/gyn as directed. Patient is given ED precautions to return to the ED for any worsening or new symptoms.     ____________________________________________  FINAL CLINICAL IMPRESSION(S) / ED DIAGNOSES  Final diagnoses:  Pregnancy, first, first trimester      NEW MEDICATIONS STARTED DURING THIS VISIT:  New Prescriptions   FOLIC ACID (FOLVITE) 400 MCG TABLET    Take 1 tablet (400 mcg total) by mouth daily.        This chart was dictated using voice recognition software/Dragon. Despite best efforts to proofread, errors can occur which can change the meaning. Any change was purely unintentional.    Enid DerryAshley Beckem Tomberlin, PA-C 11/16/16 1843    Jene Everyobert Kinner, MD 11/16/16 2125

## 2016-11-16 NOTE — ED Notes (Signed)
Nurse attempted to obtain fetal heart tones but was not able to obtain them possibly due to early pregnancy. Pt to f/o ob/gyn

## 2016-11-16 NOTE — ED Notes (Signed)
Unable to obtain fetal heart tones on pt. Per blood work pt possibly only 5 weeks, but reports last menses in December. Other nurse with attempt.

## 2016-11-24 IMAGING — US US TRANSVAGINAL NON-OB
1 series · 13 of 25 positions shown · non-contrast
Comparison: None.

CLINICAL DATA: Pelvic pain for 2 days.

EXAM:
TRANSABDOMINAL AND TRANSVAGINAL ULTRASOUND OF PELVIS
DOPPLER ULTRASOUND OF OVARIES
TECHNIQUE: Both transabdominal and transvaginal ultrasound examinations of the
pelvis were performed. Transabdominal technique was performed for
global imaging of the pelvis including uterus, ovaries, adnexal
regions, and pelvic cul-de-sac.
It was necessary to proceed with endovaginal exam following the
transabdominal exam to visualize the ovaries and endometrium.. Color
and duplex Doppler ultrasound was utilized to evaluate blood flow to
the ovaries.

[Series 1: us transvaginal non-ob · 0.20mm/px · 13 of 162 slices shown]
[im 1/162]
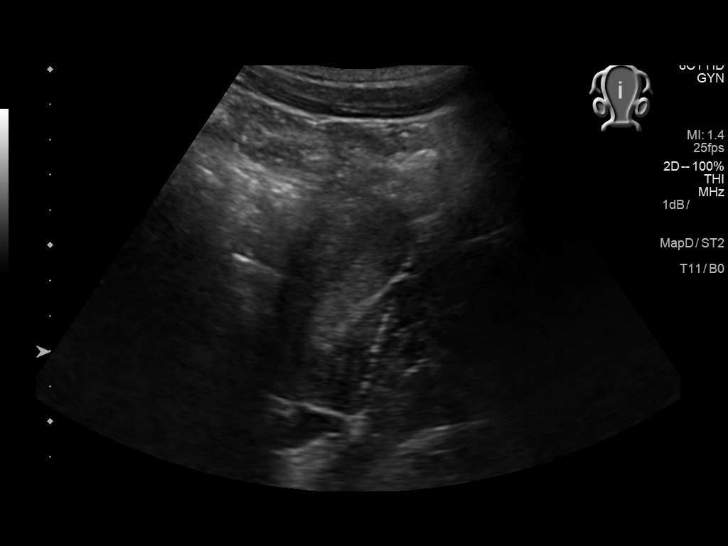
[im 14/162]
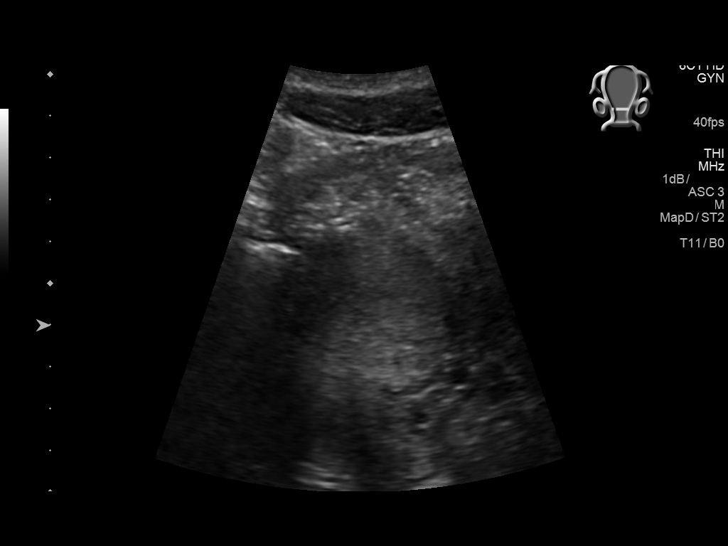
[im 27/162]
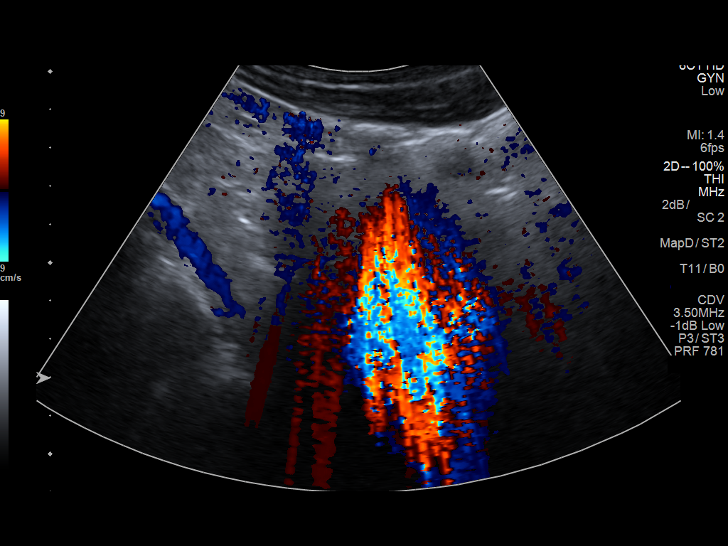
[im 41/162]
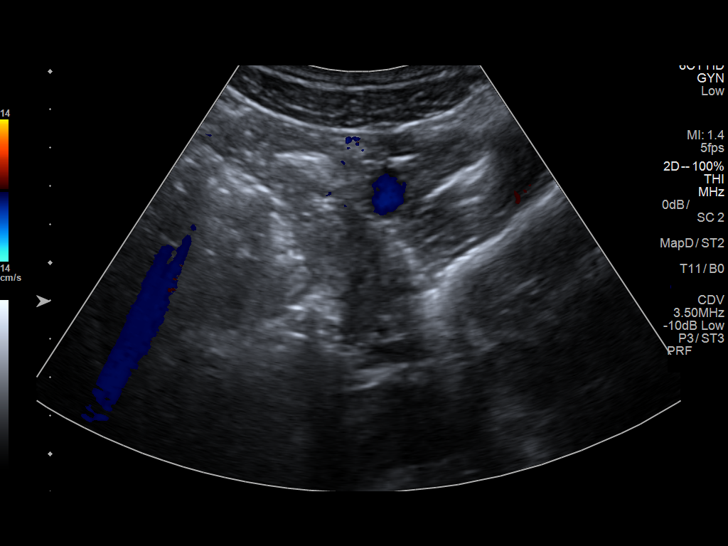
[im 54/162]
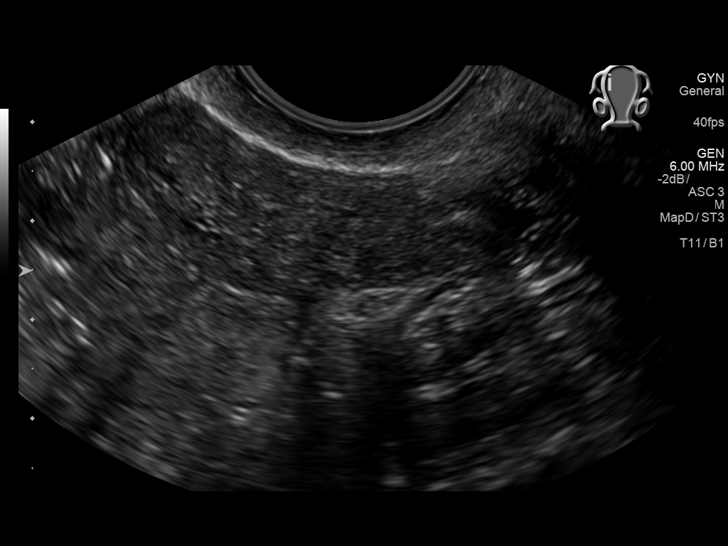
[im 68/162]
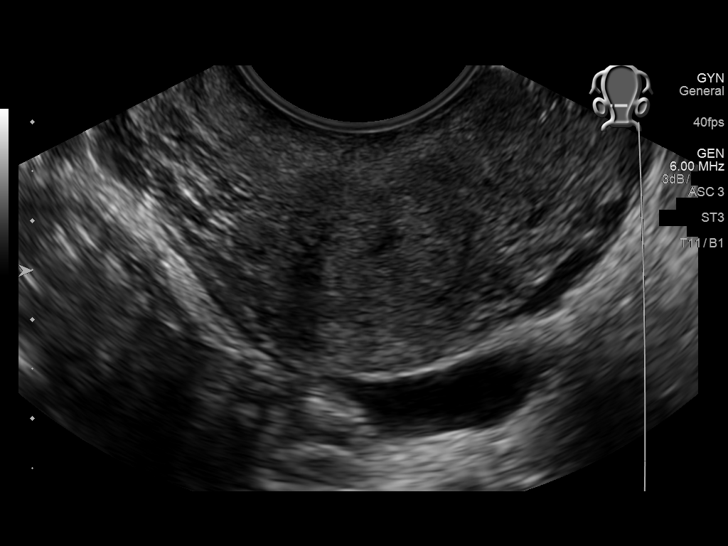
[im 81/162]
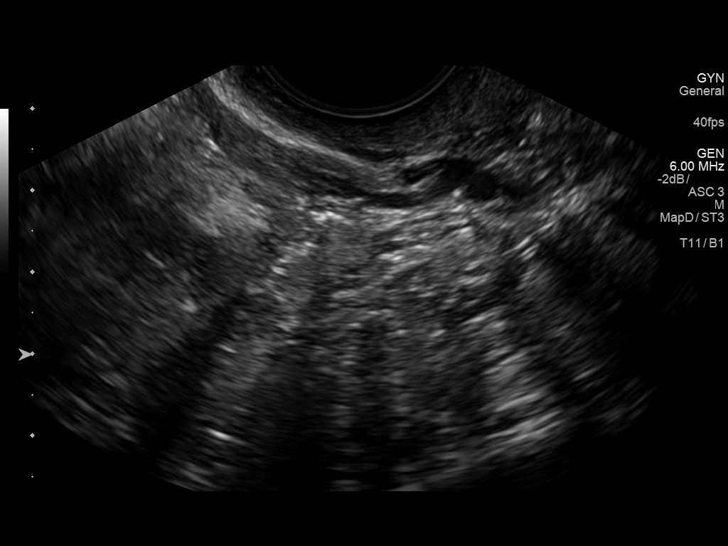
[im 94/162]
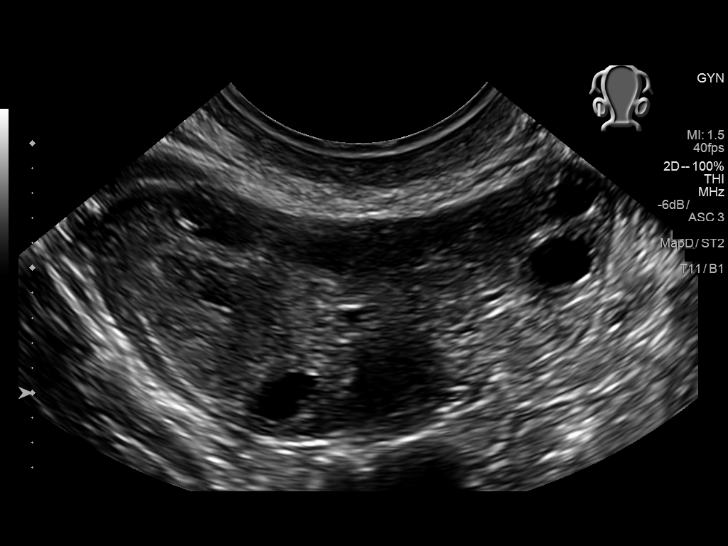
[im 108/162]
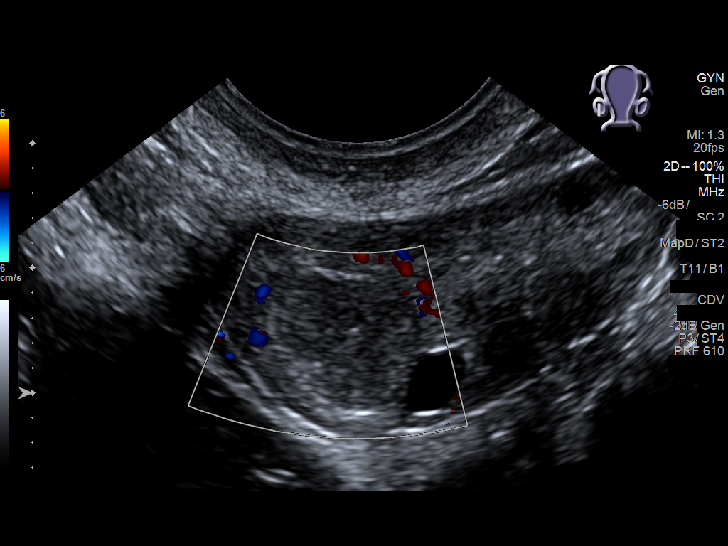
[im 121/162]
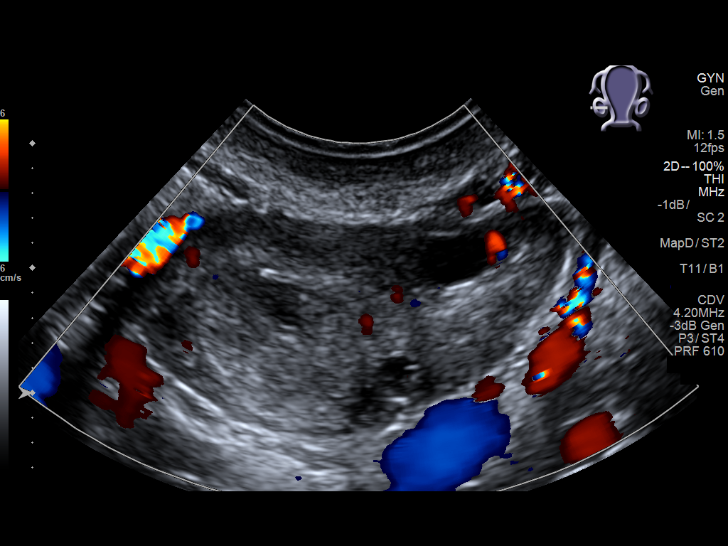
[im 135/162]
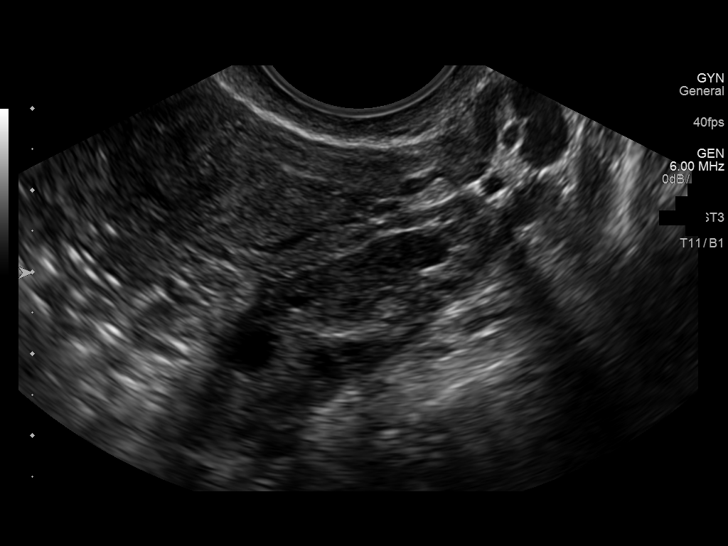
[im 148/162]
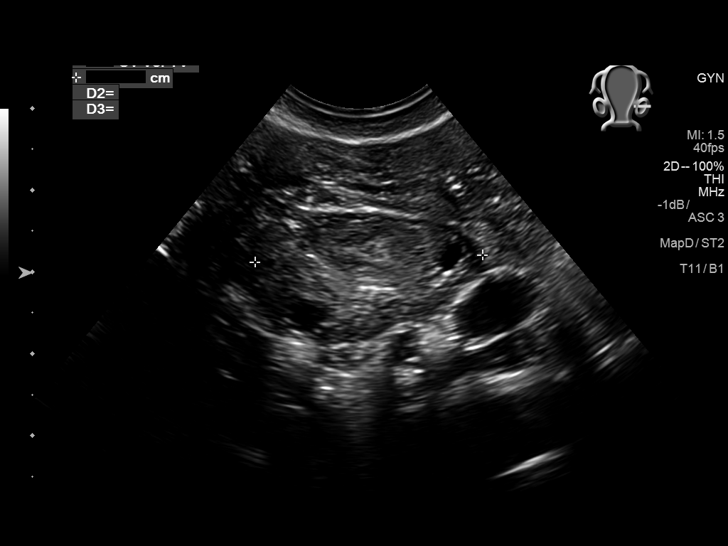
[im 162/162]
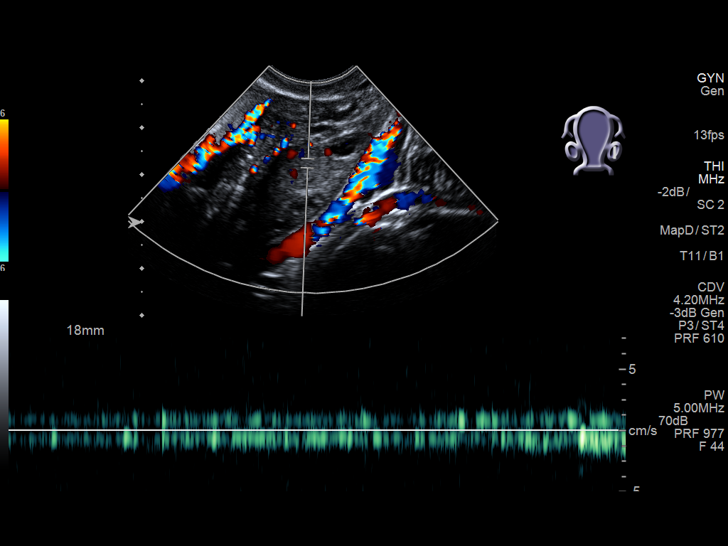

[13 of 25 positions shown; findings below may reference images not displayed]

FINDINGS: Uterus

Measurements: 6.4 x 2.1 x 3.7 cm. No fibroids or other mass
visualized.

Endometrium

Thickness: 1.7 mm.  No focal abnormality visualized.

Right ovary

Measurements: 4.2 x 1.6 x 3.4 cm. Normal appearance/no adnexal mass.
Small follicles are noted. Small hemorrhagic cyst measuring 1.2 x
1.0 x 1.4 cm.

Left ovary

Measurements: 4.3 x 1.6 x 2.8 cm. Normal appearance/no adnexal mass.

Pulsed Doppler evaluation of both ovaries demonstrates normal
low-resistance arterial and venous waveforms.

Other findings

Small amount free pelvic fluid.
IMPRESSION: 1. Normal sonographic appearance of the uterus.
2. 1.2 x 1.0 x 1.4 cm probable hemorrhagic cyst associate with the
right ovary.
3. Small amount of free pelvic fluid.

## 2016-12-02 ENCOUNTER — Emergency Department
Admission: EM | Admit: 2016-12-02 | Discharge: 2016-12-02 | Disposition: A | Discharge status: Home / Self Care | Payer: Medicaid Other | Attending: Emergency Medicine | Admitting: Emergency Medicine

## 2016-12-02 ENCOUNTER — Encounter: Payer: Self-pay | Admitting: Emergency Medicine

## 2016-12-02 ENCOUNTER — Emergency Department: Payer: Medicaid Other

## 2016-12-02 DIAGNOSIS — O209 Hemorrhage in early pregnancy, unspecified: Secondary | ICD-10-CM | POA: Diagnosis present

## 2016-12-02 DIAGNOSIS — J45909 Unspecified asthma, uncomplicated: Secondary | ICD-10-CM | POA: Diagnosis not present

## 2016-12-02 DIAGNOSIS — O4690 Antepartum hemorrhage, unspecified, unspecified trimester: Secondary | ICD-10-CM

## 2016-12-02 DIAGNOSIS — Z87891 Personal history of nicotine dependence: Secondary | ICD-10-CM | POA: Insufficient documentation

## 2016-12-02 DIAGNOSIS — N898 Other specified noninflammatory disorders of vagina: Secondary | ICD-10-CM | POA: Insufficient documentation

## 2016-12-02 DIAGNOSIS — O039 Complete or unspecified spontaneous abortion without complication: Secondary | ICD-10-CM | POA: Diagnosis not present

## 2016-12-02 LAB — CBC WITH DIFFERENTIAL/PLATELET
BASOS ABS: 0 10*3/uL (ref 0–0.1)
Basophils Relative: 1 %
EOS ABS: 0.2 10*3/uL (ref 0–0.7)
EOS PCT: 2 %
HCT: 35.4 % (ref 35.0–47.0)
Hemoglobin: 12 g/dL (ref 12.0–16.0)
Lymphocytes Relative: 31 %
Lymphs Abs: 3 10*3/uL (ref 1.0–3.6)
MCH: 29.7 pg (ref 26.0–34.0)
MCHC: 33.9 g/dL (ref 32.0–36.0)
MCV: 87.5 fL (ref 80.0–100.0)
Monocytes Absolute: 1.1 10*3/uL — ABNORMAL HIGH (ref 0.2–0.9)
Monocytes Relative: 11 %
Neutro Abs: 5.4 10*3/uL (ref 1.4–6.5)
Neutrophils Relative %: 55 %
PLATELETS: 331 10*3/uL (ref 150–440)
RBC: 4.05 MIL/uL (ref 3.80–5.20)
RDW: 16.2 % — ABNORMAL HIGH (ref 11.5–14.5)
WBC: 9.7 10*3/uL (ref 3.6–11.0)

## 2016-12-02 LAB — ABO/RH: ABO/RH(D): B POS

## 2016-12-02 LAB — HCG, QUANTITATIVE, PREGNANCY: hCG, Beta Chain, Quant, S: 1286 m[IU]/mL — ABNORMAL HIGH (ref <5)

## 2016-12-02 MED ORDER — ACETAMINOPHEN 500 MG PO TABS
ORAL_TABLET | ORAL | Status: AC
  Administered 2016-12-02: 1000 mg via ORAL
  Filled 2016-12-02: qty 2

## 2016-12-02 MED ORDER — ACETAMINOPHEN 325 MG PO TABS
650.0000 mg | ORAL_TABLET | Freq: Once | ORAL | Status: AC
  Administered 2016-12-02: 650 mg via ORAL
  Filled 2016-12-02: qty 2

## 2016-12-02 MED ORDER — ACETAMINOPHEN 500 MG PO TABS
1000.0000 mg | ORAL_TABLET | Freq: Once | ORAL | Status: AC
  Administered 2016-12-02: 1000 mg via ORAL

## 2016-12-02 NOTE — ED Notes (Signed)
Pt reports being [redacted] wks pregnant and having bright red blood vaginal discharge starting at 1600 yesterday. Pt denies saturating any pads but having constant streaks. Pt also reports cramping beginning 1 hour ago.

## 2016-12-02 NOTE — ED Provider Notes (Signed)
[facility] Emergency Department Provider Note  ____________________________________________  Time seen: Approximately 4:38 AM  I have reviewed the triage vital signs and the nursing notes.   HISTORY  Chief Complaint Abdominal Pain and Vaginal Bleeding    HPI Leslie Banks is a 21 y.o. female and complains of lower abdominal cramping pain and vaginal bleeding that started yesterday afternoon. Reports that she is about [redacted] weeks pregnant, first pregnancy. Has an appointment with [facility] clinic obstetrics tomorrow on Tuesday. She has been taking prenatal vitamins. No bleeding or other complications so far during pregnancy. No significant medical history. No vomiting, tolerating oral intake.     Past Medical History:  Diagnosis Date  . Asthma   . Scoliosis      There are no active problems to display for this patient.    History reviewed. No pertinent surgical history.   Prior to Admission medications   Medication Sig Start Date End Date Taking? Authorizing Provider  albuterol (PROVENTIL HFA;VENTOLIN HFA) 108 (90 Base) MCG/ACT inhaler Inhale 1-2 puffs into the lungs every 6 (six) hours as needed for wheezing or shortness of breath. 04/21/16  Yes Jami L Hagler, PA-C     Allergies Peanut-containing drug products   History reviewed. No pertinent family history.  Social History Social History  Substance Use Topics  . Smoking status: Former Games developer  . Smokeless tobacco: Never Used  . Alcohol use No    Review of Systems  Constitutional:   No fever or chills.  ENT:   No sore throat. No rhinorrhea. Cardiovascular:   No chest pain. Respiratory:   No dyspnea or cough. Gastrointestinal:   Positive abdominal pain as above without vomiting and diarrhea.  Genitourinary:   Negative for dysuria or difficulty urinating. Has a vaginal bleeding Musculoskeletal:   Negative for focal pain or swelling Neurological:   Negative for headaches 10-point ROS  otherwise negative.  ____________________________________________   PHYSICAL EXAM:  VITAL SIGNS: ED Triage Vitals  Enc Vitals Group     BP 12/02/16 0141 (!) 123/45     Pulse Rate 12/02/16 0141 97     Resp 12/02/16 0141 18     Temp 12/02/16 0141 97.5 F (36.4 C)     Temp Source 12/02/16 0141 Oral     SpO2 12/02/16 0141 100 %     Weight 12/02/16 0142 130 lb (59 kg)     Height 12/02/16 0142 5\' 4"  (1.626 m)     Head Circumference --      Peak Flow --      Pain Score 12/02/16 0141 10     Pain Loc --      Pain Edu? --      Excl. in GC? --     Vital signs reviewed, nursing assessments reviewed.   Constitutional:   Alert and oriented. Well appearing and in no distress. Eyes:   No scleral icterus. No conjunctival pallor. PERRL. EOMI.  No nystagmus. ENT   Head:   Normocephalic and atraumatic.   Nose:   No congestion/rhinnorhea. No septal hematoma   Mouth/Throat:   MMM, no pharyngeal erythema. No peritonsillar mass.    Neck:   No stridor. No SubQ emphysema. No meningismus. Hematological/Lymphatic/Immunilogical:   No cervical lymphadenopathy. Cardiovascular:   RRR. Symmetric bilateral radial and DP pulses.  No murmurs.  Respiratory:   Normal respiratory effort without tachypnea nor retractions. Breath sounds are clear and equal bilaterally. No wheezes/rales/rhonchi. Gastrointestinal:   Soft and nontender. Non distended. There is no CVA  tenderness.  No rebound, rigidity, or guarding. Genitourinary:   deferred Musculoskeletal:   Normal range of motion in all extremities. No joint effusions.  No lower extremity tenderness.  No edema. Neurologic:   Normal speech and language.  CN 2-10 normal. Motor grossly intact. No gross focal neurologic deficits are appreciated.  Skin:    Skin is warm, dry and intact. No rash noted.  No petechiae, purpura, or bullae.  ____________________________________________    LABS (pertinent positives/negatives) (all labs ordered are listed,  but only abnormal results are displayed) Labs Reviewed  CBC WITH DIFFERENTIAL/PLATELET - Abnormal; Notable for the following:       Result Value   RDW 16.2 (*)    Monocytes Absolute 1.1 (*)    All other components within normal limits  HCG, QUANTITATIVE, PREGNANCY - Abnormal; Notable for the following:    hCG, Beta Chain, Quant, S 1,286 (*)    All other components within normal limits  ABO/RH  SURGICAL PATHOLOGY   ____________________________________________   EKG    ____________________________________________    RADIOLOGY  [place] Ob Comp < 14 Wks  Result Date: 12/02/2016 CLINICAL DATA:  Vaginal bleeding for 1 day. Lower abdominal cramping for 2 hours. Estimated gestational age by LMP is 11 weeks 0 days. Quantitative beta HCG is 1,286. Beta HCG on 11/16/2016 was 17,107. EXAM: OBSTETRIC <14 WK US AND TRANSVAGINAL OB US TECHNIQUE: Both transabdominal and transvaginal ultrasound examinations were performed for complete evaluation of the gestation as well as the maternal uterus, adnexal regions, and pelvic cul-de-sac. Transvaginal technique was performed to assess early pregnancy. COMPARISON:  None. FINDINGS: Intrauterine gestational sac: A normal intrauterine gestational sac is not identified. There is a complex fluid collection demonstrated in the lower uterine segment/ endocervical region. This could be an abnormal gestational sac. Multiple septations are present. This may indicate molar pregnancy or hematoma indicating spontaneous abortion in progress. Yolk sac:  Not Visualized. Embryo:  Not Visualized. Cardiac Activity: Not Visualized. Maternal uterus/adnexae: Uterus is anteverted. No uterine mass lesions identified. Both ovaries are identified and appear normal. Small amount of free fluid in the abdomen. Incidental note of layering debris in the bladder. This could indicate hemorrhage, infection, or concentrated urine. The technologist indicates that the patient vomited during the  examination and felt like something came out of the vagina. Specimen was given to the patient's nurse for laboratory evaluation. IMPRESSION: Abnormal septated fluid collection demonstrated in the lower uterine segment/ endocervical region. This could represent an abnormal gestational sac, molar pregnancy, or changes of spontaneous abortion in progress. A normal intrauterine sac, fetal pole, and yolk sac are not identified. Small amount of free fluid in the pelvis. Electronically Signed   By: Burman NievesWilliam  Stevens M.D.   On: 12/02/2016 04:48   [place] Ob Transvaginal  Result Date: 12/02/2016 CLINICAL DATA:  Vaginal bleeding for 1 day. Lower abdominal cramping for 2 hours. Estimated gestational age by LMP is 11 weeks 0 days. Quantitative beta HCG is 1,286. Beta HCG on 11/16/2016 was 17,107. EXAM: OBSTETRIC <14 WK US AND TRANSVAGINAL OB US TECHNIQUE: Both transabdominal and transvaginal ultrasound examinations were performed for complete evaluation of the gestation as well as the maternal uterus, adnexal regions, and pelvic cul-de-sac. Transvaginal technique was performed to assess early pregnancy. COMPARISON:  None. FINDINGS: Intrauterine gestational sac: A normal intrauterine gestational sac is not identified. There is a complex fluid collection demonstrated in the lower uterine segment/ endocervical region. This could be an abnormal gestational sac. Multiple septations are present. This may indicate  molar pregnancy or hematoma indicating spontaneous abortion in progress. Yolk sac:  Not Visualized. Embryo:  Not Visualized. Cardiac Activity: Not Visualized. Maternal uterus/adnexae: Uterus is anteverted. No uterine mass lesions identified. Both ovaries are identified and appear normal. Small amount of free fluid in the abdomen. Incidental note of layering debris in the bladder. This could indicate hemorrhage, infection, or concentrated urine. The technologist indicates that the patient vomited during the examination and felt  like something came out of the vagina. Specimen was given to the patient's nurse for laboratory evaluation. IMPRESSION: Abnormal septated fluid collection demonstrated in the lower uterine segment/ endocervical region. This could represent an abnormal gestational sac, molar pregnancy, or changes of spontaneous abortion in progress. A normal intrauterine sac, fetal pole, and yolk sac are not identified. Small amount of free fluid in the pelvis. Electronically Signed   By: Burman Nieves M.D.   On: 12/02/2016 04:48    ____________________________________________   PROCEDURES Procedures  ____________________________________________   INITIAL IMPRESSION / ASSESSMENT AND PLAN / ED COURSE  Pertinent labs & imaging results that were available during my care of the patient were reviewed by me and considered in my medical decision making (see chart for details).  Patient nontoxic and not in distress. Well appearing. Presents with vaginal bleeding and pelvic pain, concerning for miscarriage. Pelvic ultrasound ordered. Tylenol for pain. Rh+.  While patient was having ultrasound done, she passed a small amount of tissue consistent with gestational sac and products of conception. HCG has fallen from 17,000 2.5 weeks ago to 1200 today. Consistent with miscarriage. Follow-up ultrasound report. Plan for follow-up with obstetrics at her appointment on Tuesday tomorrow.         ____________________________________________   FINAL CLINICAL IMPRESSION(S) / ED DIAGNOSES  Final diagnoses:  Miscarriage  Vaginal bleeding in pregnancy      New Prescriptions   No medications on file     Portions of this note were generated with dragon dictation software. Dictation errors may occur despite best attempts at proofreading.    Sharman Cheek, MD 12/02/16 [phone]

## 2016-12-02 NOTE — ED Triage Notes (Addendum)
Pt taken straight to treatment room upon check in; Pt here on 11/16/16 after having a positive home pregnancy test and experiencing reflux; Beta HCG confirmed pregnancy; per LMP pt is 2252w0d pregnant; first OB/GYN appointment is on Tuesday of this week; pt presents tonight with vaginal bleeding that started earlier today, light pink tinged; now having abd cramping and bright red vaginal bleeding; G1P0; pt vomited after being taken to treatment room

## 2016-12-03 LAB — SURGICAL PATHOLOGY

## 2017-04-25 IMAGING — US US OB COMP LESS 14 WK
1 series · 13 of 28 positions shown · non-contrast
Comparison: None.

CLINICAL DATA: Vaginal bleeding for 1 day. Lower abdominal cramping
for 2 hours. Estimated gestational age by LMP is 11 weeks 0 days.

EXAM:
OBSTETRIC <14 WK US AND TRANSVAGINAL OB US
TECHNIQUE: Both transabdominal and transvaginal ultrasound examinations were
performed for complete evaluation of the gestation as well as the
maternal uterus, adnexal regions, and pelvic cul-de-sac.
Transvaginal technique was performed to assess early pregnancy.

[Series 1: us ob comp less 14 wk · 0.22mm/px · 13 of 84 slices shown]
[im 4/84]
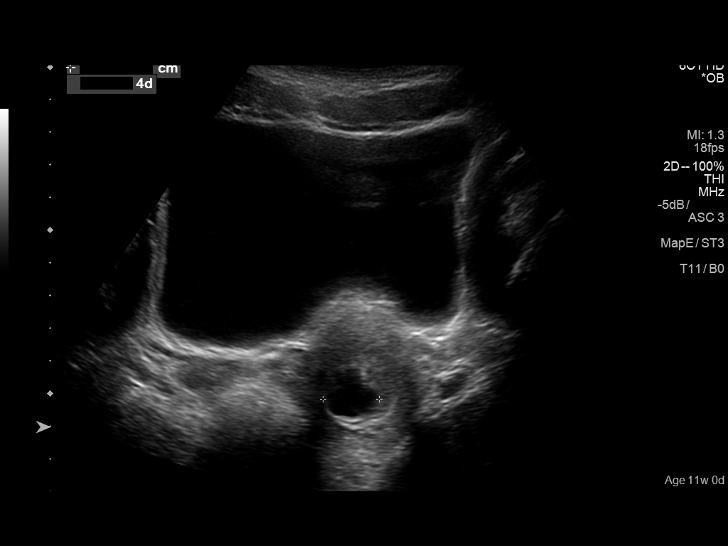
[im 10/84]
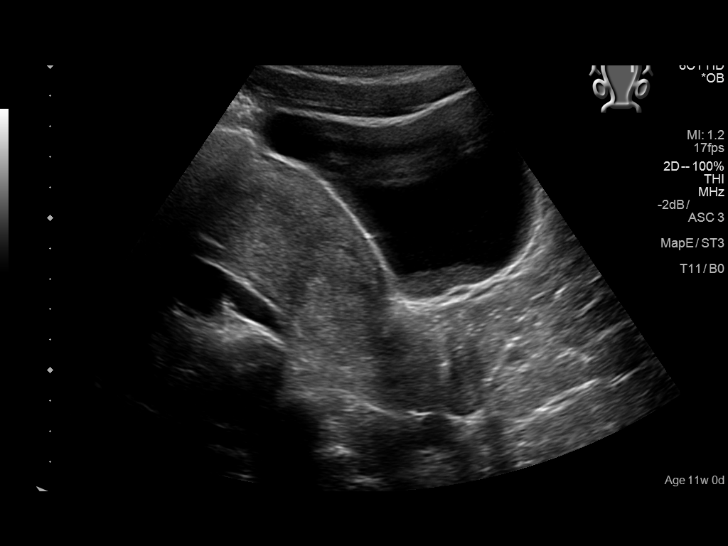
[im 16/84]
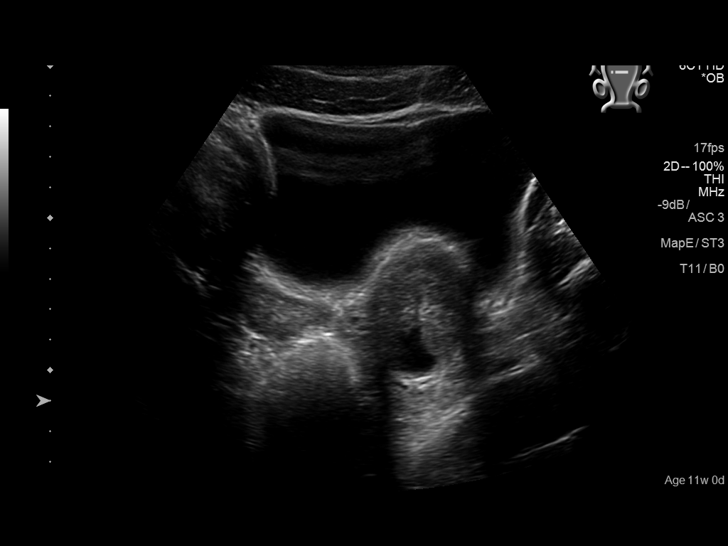
[im 22/84]
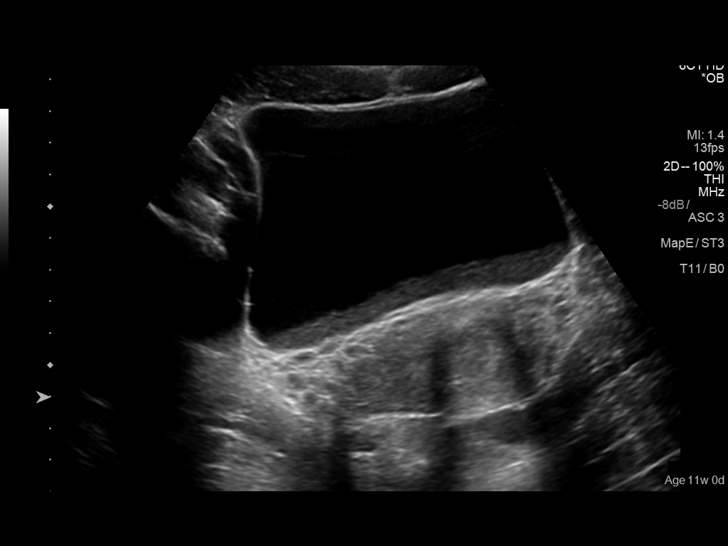
[im 28/84]
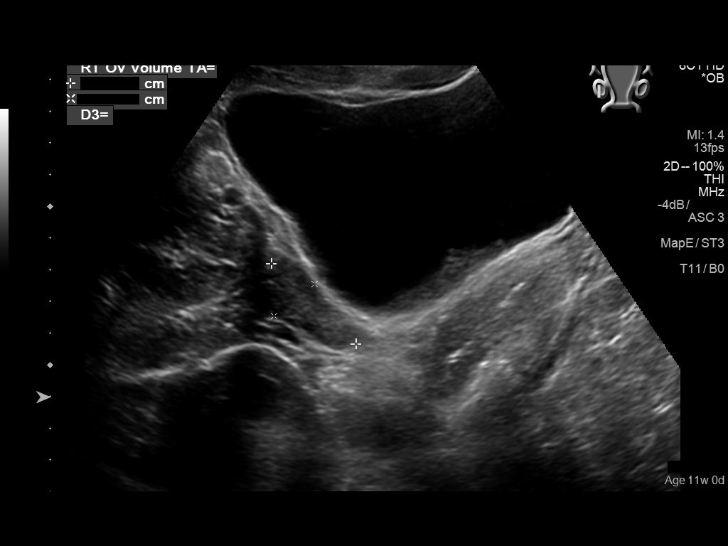
[im 34/84]
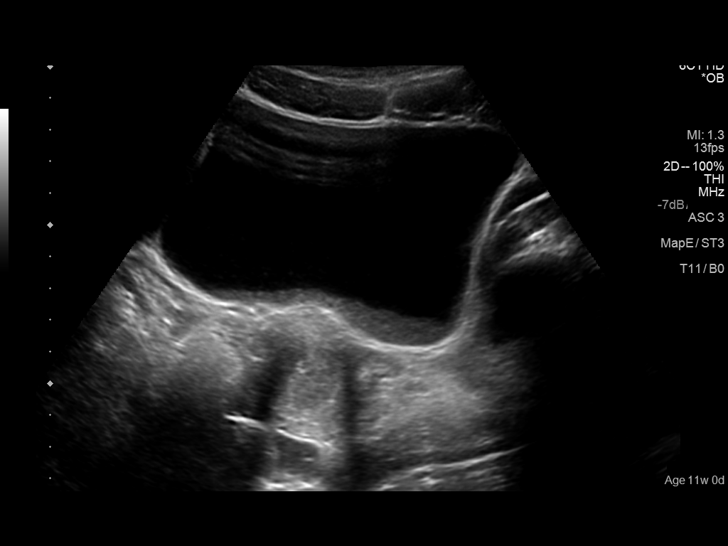
[im 44/84]
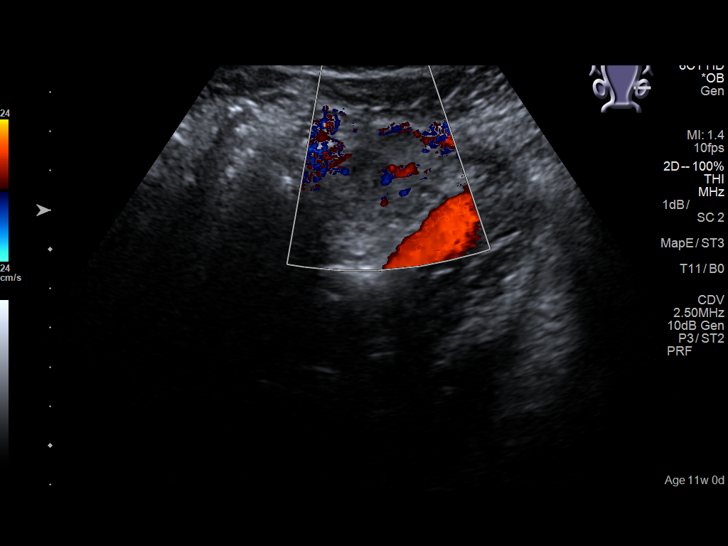
[im 50/84]
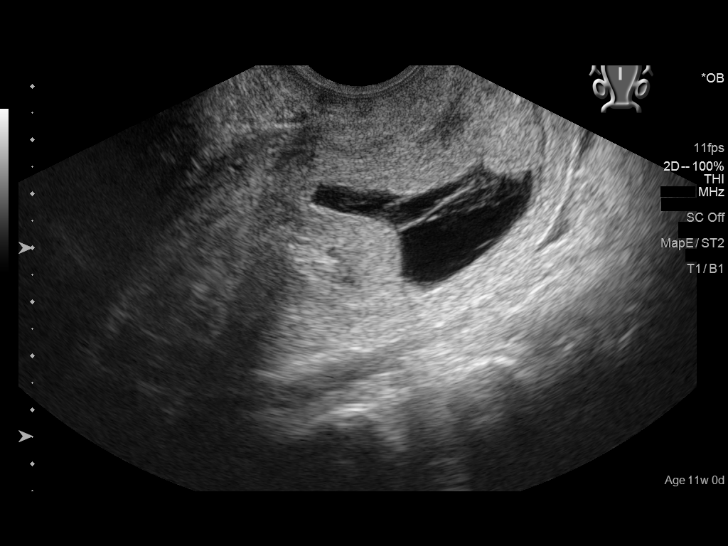
[im 56/84]
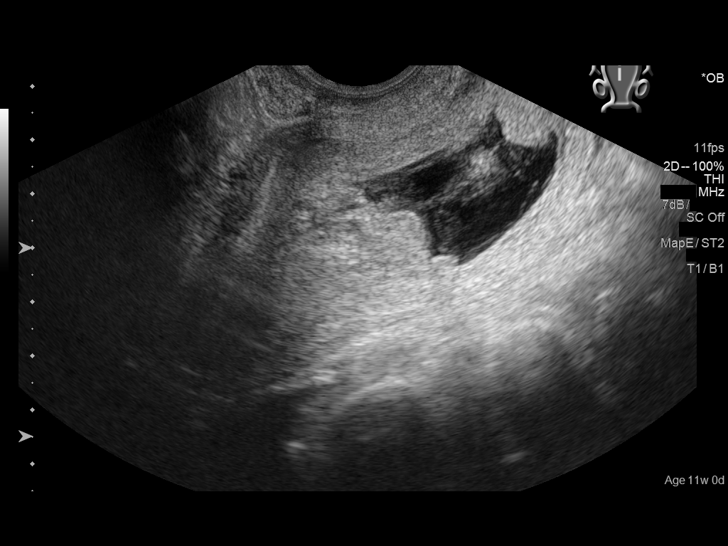
[im 62/84]
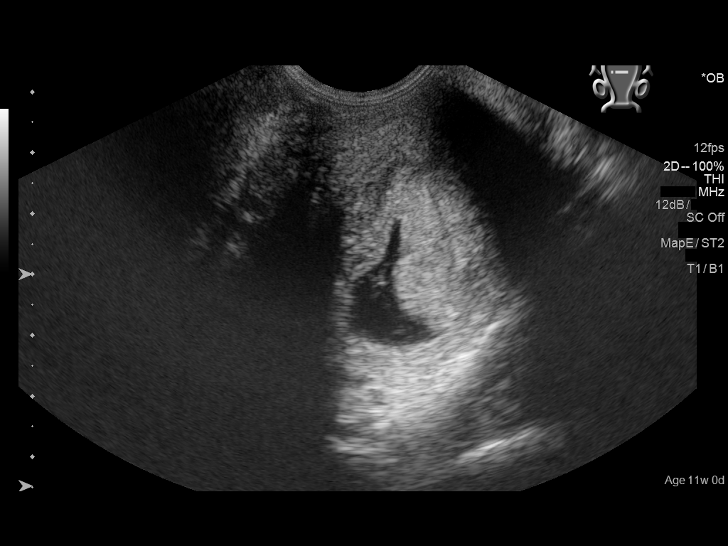
[im 68/84]
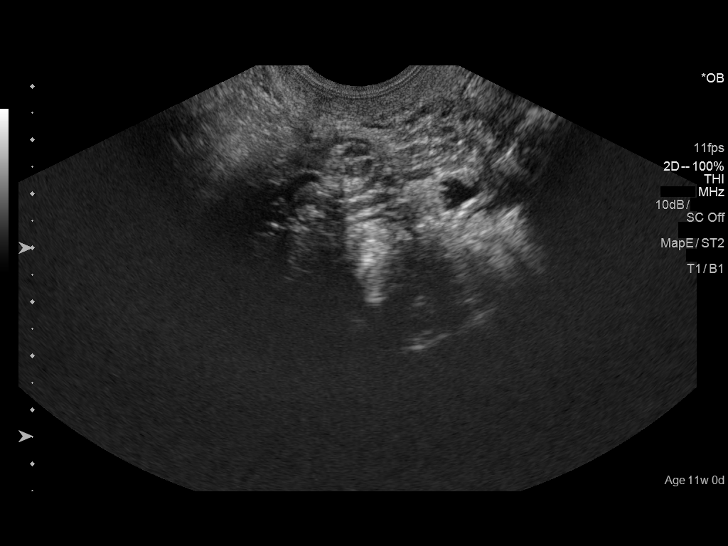
[im 74/84]
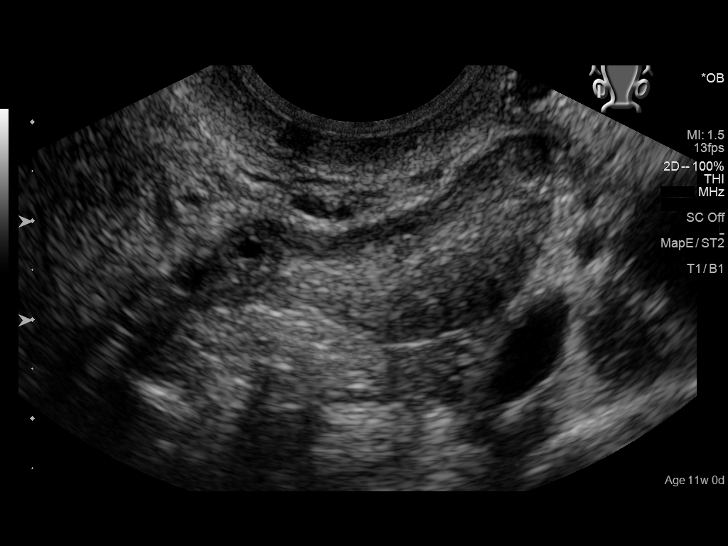
[im 80/84]
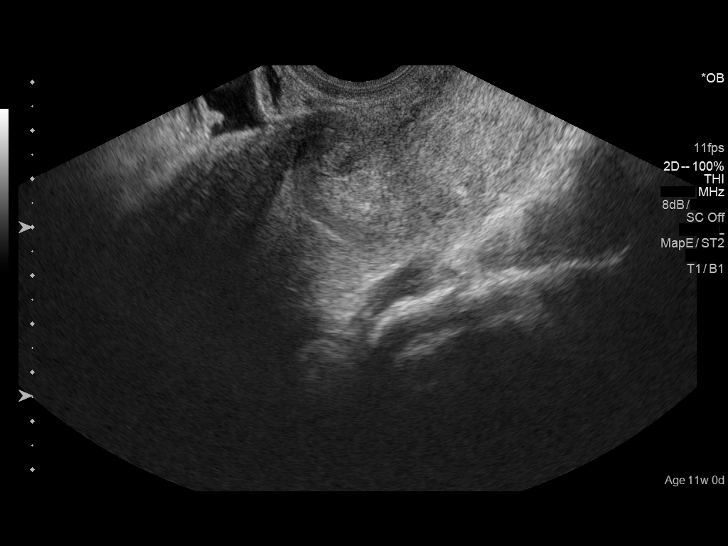

[13 of 28 positions shown; findings below may reference images not displayed]

FINDINGS: Intrauterine gestational sac: A normal intrauterine gestational sac
is not identified. There is a complex fluid collection demonstrated
in the lower uterine segment/ endocervical region. This could be an
abnormal gestational sac. Multiple septations are present. This may
indicate molar pregnancy or hematoma indicating spontaneous abortion
in progress.

Yolk sac:  Not Visualized.

Embryo:  Not Visualized.

Cardiac Activity: Not Visualized.

Maternal uterus/adnexae: Uterus is anteverted. No uterine mass
lesions identified. Both ovaries are identified and appear normal.
Small amount of free fluid in the abdomen.

Incidental note of layering debris in the bladder. This could
indicate hemorrhage, infection, or concentrated urine.

The technologist indicates that the patient vomited during the
examination and felt like something came out of the vagina. Specimen
was given to the patient's nurse for laboratory evaluation.
IMPRESSION: Abnormal septated fluid collection demonstrated in the lower uterine
segment/ endocervical region. This could represent an abnormal
gestational sac, molar pregnancy, or changes of spontaneous abortion
in progress. A normal intrauterine sac, fetal pole, and yolk sac are
not identified. Small amount of free fluid in the pelvis.

## 2017-09-02 ENCOUNTER — Encounter: Payer: Self-pay | Admitting: Emergency Medicine

## 2017-09-02 ENCOUNTER — Emergency Department
Admission: EM | Admit: 2017-09-02 | Discharge: 2017-09-02 | Disposition: A | Discharge status: Home / Self Care | Payer: Self-pay | Attending: Emergency Medicine | Admitting: Emergency Medicine

## 2017-09-02 DIAGNOSIS — Z87891 Personal history of nicotine dependence: Secondary | ICD-10-CM | POA: Insufficient documentation

## 2017-09-02 DIAGNOSIS — F329 Major depressive disorder, single episode, unspecified: Secondary | ICD-10-CM | POA: Insufficient documentation

## 2017-09-02 DIAGNOSIS — Y999 Unspecified external cause status: Secondary | ICD-10-CM | POA: Insufficient documentation

## 2017-09-02 DIAGNOSIS — F489 Nonpsychotic mental disorder, unspecified: Secondary | ICD-10-CM | POA: Insufficient documentation

## 2017-09-02 DIAGNOSIS — Z7289 Other problems related to lifestyle: Secondary | ICD-10-CM

## 2017-09-02 DIAGNOSIS — X781XXA Intentional self-harm by knife, initial encounter: Secondary | ICD-10-CM | POA: Insufficient documentation

## 2017-09-02 DIAGNOSIS — Y939 Activity, unspecified: Secondary | ICD-10-CM | POA: Insufficient documentation

## 2017-09-02 DIAGNOSIS — S51812A Laceration without foreign body of left forearm, initial encounter: Secondary | ICD-10-CM | POA: Insufficient documentation

## 2017-09-02 DIAGNOSIS — F1210 Cannabis abuse, uncomplicated: Secondary | ICD-10-CM | POA: Insufficient documentation

## 2017-09-02 DIAGNOSIS — F4321 Adjustment disorder with depressed mood: Secondary | ICD-10-CM

## 2017-09-02 DIAGNOSIS — Y929 Unspecified place or not applicable: Secondary | ICD-10-CM | POA: Insufficient documentation

## 2017-09-02 LAB — SALICYLATE LEVEL

## 2017-09-02 LAB — COMPREHENSIVE METABOLIC PANEL
ALK PHOS: 60 U/L (ref 38–126)
ALT: 11 U/L — ABNORMAL LOW (ref 14–54)
ANION GAP: 10 (ref 5–15)
AST: 24 U/L (ref 15–41)
Albumin: 4.1 g/dL (ref 3.5–5.0)
BILIRUBIN TOTAL: 0.5 mg/dL (ref 0.3–1.2)
BUN: 10 mg/dL (ref 6–20)
CALCIUM: 9.1 mg/dL (ref 8.9–10.3)
CO2: 20 mmol/L — ABNORMAL LOW (ref 22–32)
CREATININE: 0.66 mg/dL (ref 0.44–1.00)
Chloride: 106 mmol/L (ref 101–111)
GFR calc non Af Amer: >60 mL/min (ref >60)
GLUCOSE: 94 mg/dL (ref 65–99)
Potassium: 3.5 mmol/L (ref 3.5–5.1)
Sodium: 136 mmol/L (ref 135–145)
TOTAL PROTEIN: 7.4 g/dL (ref 6.5–8.1)

## 2017-09-02 LAB — CBC
HEMATOCRIT: 36.8 % (ref 35.0–47.0)
Hemoglobin: 12.4 g/dL (ref 12.0–16.0)
MCH: 29 pg (ref 26.0–34.0)
MCHC: 33.7 g/dL (ref 32.0–36.0)
MCV: 85.9 fL (ref 80.0–100.0)
Platelets: 334 10*3/uL (ref 150–440)
RBC: 4.29 MIL/uL (ref 3.80–5.20)
RDW: 15 % — AB (ref 11.5–14.5)
WBC: 8.7 10*3/uL (ref 3.6–11.0)

## 2017-09-02 LAB — URINE DRUG SCREEN, QUALITATIVE (ARMC ONLY)
Amphetamines, Ur Screen: NOT DETECTED
Barbiturates, Ur Screen: NOT DETECTED
Benzodiazepine, Ur Scrn: NOT DETECTED
Cannabinoid 50 Ng, Ur ~~LOC~~: POSITIVE — AB
Cocaine Metabolite,Ur ~~LOC~~: NOT DETECTED
MDMA (Ecstasy)Ur Screen: NOT DETECTED
METHADONE SCREEN, URINE: NOT DETECTED
OPIATE, UR SCREEN: NOT DETECTED
Phencyclidine (PCP) Ur S: NOT DETECTED
Tricyclic, Ur Screen: NOT DETECTED

## 2017-09-02 LAB — ETHANOL: Alcohol, Ethyl (B): 48 mg/dL — ABNORMAL HIGH (ref <10)

## 2017-09-02 LAB — ACETAMINOPHEN LEVEL

## 2017-09-02 NOTE — ED Notes (Signed)
SOC in progress.  

## 2017-09-02 NOTE — ED Notes (Signed)
Patient is resting comfortably. 

## 2017-09-02 NOTE — Discharge Instructions (Signed)
Return to the ER for worsening symptoms, feelings of hurting yourself or others, or other concerns. 

## 2017-09-02 NOTE — ED Notes (Signed)
Patient came out of room and asking to call family or if we have notified family. Advised patient phone times will begin again at 9am. Unless she sees tele-psych and that doctor recommends discharge until then she will need to stay in room because dayroom is closed.

## 2017-09-02 NOTE — ED Notes (Signed)
SOC complete.  

## 2017-09-02 NOTE — ED Notes (Signed)
Patient has bandages on left forearm from self injury but cutting. Bandages are intact.

## 2017-09-02 NOTE — ED Notes (Signed)
Steri strips applied and bandage with silk tape instead of guaze due [facility] Pt tolerated well. Instructions not to pick or pull at steri strips. Pt verbalized understanding.

## 2017-09-02 NOTE — ED Notes (Signed)
[facility] machine set up in room.

## 2017-09-02 NOTE — ED Notes (Signed)
Patient denies SI,HI and AVH.Verbalized understanding of dscharge instructions.Patient left unit with staff to the lobby.

## 2017-09-02 NOTE — ED Notes (Signed)
Pt dressed out in burgundy scrubs.  Rt forearm bandaged.  Pts belongings were labelled and set at quad nurses station.  Belongings include shoes, socks, skirt, green jacket, bra and tshirt.  No purse, phone or other valuables.  No jewelry.

## 2017-09-02 NOTE — ED Notes (Signed)
[facility]

## 2017-09-02 NOTE — ED Notes (Signed)
Pt. To B[facility] from ED ambulatory without difficulty, to room  [facility]. [facility] Pt. Is alert and oriented, warm and dry in no distress. Pt. Denies SI, HI, and AVH. After asking patient assessment questions patients states I am not mental.  Pt. Calm and cooperative but tearful during assessment. Pt. Made aware of security cameras and Q15 minute rounds. Pt. Encouraged to let Nursing staff know of any concerns or needs.

## 2017-09-02 NOTE — ED Provider Notes (Signed)
[facility] Emergency Department Provider Note   ____________________________________________   First MD Initiated Contact with Patient 09/02/17 0113     (approximate)  I have reviewed the triage vital signs and the nursing notes.   HISTORY  Chief Complaint Suicidal    HPI Leslie Banks is a 21 y.o. female who [place] [facility] [place] [facility] [place] and depression.  Patient has been depressed since at miscarriage in April of this year.  Does not take medications for depression.  Denies active SI/HI/AH/VH.  States she cut herself with a knife to relieve some stress.  Tetanus is up-to-date.  Voices no other medical complaints.   Past Medical History:  Diagnosis Date  . Asthma   . Scoliosis     There are no active problems to display for this patient.   History reviewed. No pertinent surgical history.  Prior to Admission medications   Medication Sig Start Date End Date Taking? Authorizing Provider  albuterol (PROVENTIL HFA;VENTOLIN HFA) 108 (90 Base) MCG/ACT inhaler Inhale 1-2 puffs into the lungs every 6 (six) hours as needed for wheezing or shortness of breath. 04/21/16   Hagler, Jami L, PA-C    Allergies Peanut-containing drug products  History reviewed. No pertinent family history.  Social History Social History   Tobacco Use  . Smoking status: Former Games developermoker  . Smokeless tobacco: Never Used  Substance Use Topics  . Alcohol use: No  . Drug use: No    Review of Systems  Constitutional: No fever/chills. Eyes: No visual changes. ENT: No sore throat. Cardiovascular: Denies chest pain. Respiratory: Denies shortness of breath. Gastrointestinal: No abdominal pain.  No nausea, no vomiting.  No diarrhea.  No constipation. Genitourinary: Negative for dysuria. Musculoskeletal: Positive for lacerations to left forearm.  Negative for back pain. Skin: Negative for rash. Neurological: Negative for headaches, focal  weakness or numbness. Psychiatric:Positive for depression.   ____________________________________________   PHYSICAL EXAM:  VITAL SIGNS: ED Triage Vitals [09/02/17 0047]  Enc Vitals Group     BP 113/87     Pulse Rate (!) 110     Resp 17     Temp 98 F (36.7 C)     Temp Source Oral     SpO2 100 %     Weight 130 lb (59 kg)     Height      Head Circumference      Peak Flow      Pain Score      Pain Loc      Pain Edu?      Excl. in GC?     Constitutional: Alert and oriented. Well appearing and in no acute distress. Eyes: Conjunctivae are normal. PERRL. EOMI. Head: Atraumatic. Nose: No congestion/rhinnorhea. Mouth/Throat: Mucous membranes are moist.  Oropharynx non-erythematous. Neck: No stridor.   Cardiovascular: Normal rate, regular rhythm. Grossly normal heart sounds.  Good peripheral circulation. Respiratory: Normal respiratory effort.  No retractions. Lungs CTAB. Gastrointestinal: Soft and nontender. No distention. No abdominal bruits. No CVA tenderness. Musculoskeletal: Multiple extremely superficial lacerations to forearm without breaching adipose tissue and without bleeding.  2+ radial pulse.  Brisk, less than 5-second capillary refill. Neurologic:  Normal speech and language. No gross focal neurologic deficits are appreciated. No gait instability. Skin:  Skin is warm, dry and intact. No rash noted. Psychiatric: Mood and affect are flat. Speech and behavior are normal.  ____________________________________________   LABS (all labs ordered are listed, but only abnormal results are displayed)  Labs Reviewed  COMPREHENSIVE METABOLIC PANEL - Abnormal; Notable for the following components:      Result Value   CO2 20 (*)    ALT 11 (*)    All other components within normal limits  ETHANOL - Abnormal; Notable for the following components:   Alcohol, Ethyl (B) 48 (*)    All other components within normal limits  ACETAMINOPHEN LEVEL - Abnormal; Notable for the  following components:   Acetaminophen (Tylenol), Serum <10 (*)    All other components within normal limits  CBC - Abnormal; Notable for the following components:   RDW 15.0 (*)    All other components within normal limits  URINE DRUG SCREEN, QUALITATIVE ([facility] ONLY) - Abnormal; Notable for the following components:   Cannabinoid 50 Ng, Ur Leslie Banks POSITIVE (*)    All other components within normal limits  SALICYLATE LEVEL  POC URINE PREG, ED   ____________________________________________  EKG  None ____________________________________________  RADIOLOGY  No results found.  ____________________________________________   PROCEDURES  Procedure(s) performed:   Procedures  Critical Care performed: No  ____________________________________________   INITIAL IMPRESSION / ASSESSMENT AND PLAN / ED COURSE  As part of my medical decision making, I reviewed the following data within the electronic MEDICAL RECORD NUMBER Labs reviewed, A consult was requested and obtained from this/these consultant(s) Psychiatry and Notes from prior ED visits.   21 year old female brought under IVC for depression with self-inflicted lacerations.  Laboratory and urinalysis results remarkable for cannabinoids.  Patient is medically cleared [facility].  Nurse applied Steri-Strips to superficial lacerations on patient's left forearm.  Clinical Course as of Sep 02 626  Tue Sep 02, 2017  [id number] [facility] Garnetta BuddyFaheem who reversed her IVC.  Feels she is psychiatrically stable for discharge home with outpatient follow-up.  Strict return precautions given.  Patient verbalizes understanding and agrees with plan of care.  [JS]    Clinical Course User Index [JS] Irean HongSung, Jade J, MD     ____________________________________________   FINAL CLINICAL IMPRESSION(S) / ED DIAGNOSES  Final diagnoses:  Marijuana abuse  Deliberate  self-cutting  Adjustment disorder with depressed mood     ED Discharge Orders    None       Note:  This document was prepared using Dragon voice recognition software and may include unintentional dictation errors.    Irean HongSung, Jade J, MD 09/02/17 [phone]

## 2017-09-02 NOTE — ED Notes (Signed)
Pt to the er under IVC for self harm. Pt was at home tonight and cut her left forearm with a kitchen knife. I asked pt if she was attempting to kill herself or release some of the emotional pain she was experiencing. Pt states she cut herself for attention. Pt says that her and her mom got in an argument last night. Pt says mom does not understand what she is going through. Pt lives with her boyfriend. Pt states [facility] and people used to come out to her grandmother's house. She took antidepressants that started with a Z but stated that they just made her sleepy. I asked pt if she wanted to be admitted and to get some help. Pt said she wanted to start seeing a therapist again. Pt said she was not interested in taking those meds again. Pt states she would not kill herself if she was discharged. Pt asked to call boyfriend and let him know where she was. Pt given phone. Pt then asked staff to call her mother and let her know where she was. This RN declines as it is 0100 in the am and pt is stable and safe. Do not want to encourage the attention seeking behavior expressed by pt.

## 2017-09-02 NOTE — ED Triage Notes (Signed)
Pt arrived to ED via [facility] under IVC. Per affidavit pt was in argument with significant other tonight and cut herself on the left forearm with a knife to hurt herself. Per pt she has been battling depression since having a miscarriage in April. Pt has hx/o depression but has not been on medication. Pt asking for help in triage, tearful, calm and cooperative. Pt has 2 approximate 2 inch lacerations to the left forearm. New bandage applied. Bleeding controled at this time.

## 2017-09-02 NOTE — ED Notes (Signed)
Discharge paperwork reviewed with patient. Patient denies SI/HI/AVH. Belongings given to patient. Patient is currently waiting on ride to go home.
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
## 2017-09-04 LAB — POCT PREGNANCY, URINE: PREG TEST UR: NEGATIVE

## 2017-09-21 ENCOUNTER — Emergency Department
Admission: EM | Admit: 2017-09-21 | Discharge: 2017-09-21 | Disposition: A | Discharge status: Home / Self Care | Payer: Self-pay | Attending: Emergency Medicine | Admitting: Emergency Medicine

## 2017-09-21 ENCOUNTER — Other Ambulatory Visit: Payer: Self-pay

## 2017-09-21 ENCOUNTER — Emergency Department: Payer: Self-pay

## 2017-09-21 ENCOUNTER — Encounter: Payer: Self-pay | Admitting: Emergency Medicine

## 2017-09-21 DIAGNOSIS — R0789 Other chest pain: Secondary | ICD-10-CM | POA: Insufficient documentation

## 2017-09-21 DIAGNOSIS — M419 Scoliosis, unspecified: Secondary | ICD-10-CM | POA: Insufficient documentation

## 2017-09-21 DIAGNOSIS — Z91010 Allergy to peanuts: Secondary | ICD-10-CM | POA: Insufficient documentation

## 2017-09-21 DIAGNOSIS — J45909 Unspecified asthma, uncomplicated: Secondary | ICD-10-CM | POA: Insufficient documentation

## 2017-09-21 DIAGNOSIS — Z87891 Personal history of nicotine dependence: Secondary | ICD-10-CM | POA: Insufficient documentation

## 2017-09-21 LAB — CBC
HCT: 37.1 % (ref 35.0–47.0)
HEMOGLOBIN: 12.3 g/dL (ref 12.0–16.0)
MCH: 28.9 pg (ref 26.0–34.0)
MCHC: 33.2 g/dL (ref 32.0–36.0)
MCV: 87.2 fL (ref 80.0–100.0)
PLATELETS: 351 10*3/uL (ref 150–440)
RBC: 4.26 MIL/uL (ref 3.80–5.20)
RDW: 14.8 % — ABNORMAL HIGH (ref 11.5–14.5)
WBC: 4.9 10*3/uL (ref 3.6–11.0)

## 2017-09-21 LAB — BASIC METABOLIC PANEL
ANION GAP: 6 (ref 5–15)
BUN: 7 mg/dL (ref 6–20)
CALCIUM: 9.3 mg/dL (ref 8.9–10.3)
CHLORIDE: 106 mmol/L (ref 101–111)
CO2: 25 mmol/L (ref 22–32)
CREATININE: 0.65 mg/dL (ref 0.44–1.00)
GFR calc non Af Amer: >60 mL/min (ref >60)
Glucose, Bld: 96 mg/dL (ref 65–99)
Potassium: 3.7 mmol/L (ref 3.5–5.1)
SODIUM: 137 mmol/L (ref 135–145)

## 2017-09-21 LAB — TROPONIN I

## 2017-09-21 MED ORDER — CYCLOBENZAPRINE HCL 10 MG PO TABS: 10.0000 mg | ORAL_TABLET | Freq: Three times a day (TID) | ORAL | 0 refills | Status: DC | PRN

## 2017-09-21 MED ORDER — IBUPROFEN 600 MG PO TABS: 600.0000 mg | ORAL_TABLET | Freq: Four times a day (QID) | ORAL | 0 refills | Status: DC | PRN

## 2017-09-21 NOTE — Discharge Instructions (Signed)
Return to the ER for new, worsening, or persistent chest pain, difficulty breathing, weakness or lightheadedness, high fevers, or any other new or worsening symptoms that concern you.  You should also return if you have a new episode of the swelling and pain in your toe.  You can take the medication prescribed if you have another episode of the pain and it lasts for longer than a few minutes.  You do not need to take the medication otherwise.

## 2017-09-21 NOTE — ED Triage Notes (Addendum)
Pt arrived via EMS from home for reports of left side chest pain that radiates to left shoulder. Pt reports the pain feels like a spasm. Pt denies any associated symptoms. Pt reports pain began today. Pt denies drug use with the exception of marijuana. EMS reports VSS. Pt reports pain is intermittent.

## 2017-09-21 NOTE — ED Provider Notes (Signed)
[facility] Emergency Department Provider Note ____________________________________________   First MD Initiated Contact with Patient 09/21/17 1533     (approximate)  I have reviewed the triage vital signs and the nursing notes.   HISTORY  Chief Complaint Chest Pain    HPI Leslie Banks is a 21 y.o. female with past medical history as noted below who presents with chest pain, occurring for approximately the last week, and intermittent.  Patient states that she has the pain for approximately 30 seconds and only once every few days.  She states the pain is sharp, and radiates from her sternum up to her left trapezius area, but does not involve the back.  In between she has no pain whatsoever.  She denies any shortness of breath, weakness, fever or chills, or cough.  She does report that during 1 of the episodes today she felt a little bit of ringing in her right ear and felt slightly lightheaded but this passed after short time.  She did not feel like she was going to pass out.  Past Medical History:  Diagnosis Date  . Asthma   . Scoliosis     There are no active problems to display for this patient.   No past surgical history on file.  Prior to Admission medications   Medication Sig Start Date End Date Taking? Authorizing Provider  albuterol (PROVENTIL HFA;VENTOLIN HFA) 108 (90 Base) MCG/ACT inhaler Inhale 1-2 puffs into the lungs every 6 (six) hours as needed for wheezing or shortness of breath. 04/21/16   Hagler, Jami L, PA-C  cyclobenzaprine (FLEXERIL) 10 MG tablet Take 1 tablet (10 mg total) by mouth 3 (three) times daily as needed for muscle spasms. 09/21/17   Dionne BucySiadecki, Isais Klipfel, MD  ibuprofen (ADVIL,MOTRIN) 600 MG tablet Take 1 tablet (600 mg total) by mouth every 6 (six) hours as needed. 09/21/17   Dionne BucySiadecki, Appolonia Ackert, MD    Allergies Peanut-containing drug products  No family history on file.  Social History Social History   Tobacco Use    . Smoking status: Former Games developermoker  . Smokeless tobacco: Never Used  Substance Use Topics  . Alcohol use: No  . Drug use: No    Review of Systems  Constitutional: No fever/chills. Eyes: No visual changes. ENT: No sore throat. Cardiovascular: Positive for intermittent chest pain. Respiratory: Denies shortness of breath. Gastrointestinal: No nausea, no vomiting.   Genitourinary: Negative for flank pain.  Musculoskeletal: Negative for back pain. Skin: Negative for rash. Neurological: Negative for headache.   ____________________________________________   PHYSICAL EXAM:  VITAL SIGNS: ED Triage Vitals  Enc Vitals Group     BP 09/21/17 1444 117/67     Pulse Rate 09/21/17 1444 96     Resp 09/21/17 1444 18     Temp 09/21/17 1444 98.6 F (37 C)     Temp Source 09/21/17 1444 Oral     SpO2 09/21/17 1444 100 %     Weight 09/21/17 1445 130 lb (59 kg)     Height 09/21/17 1445 5\' 6"  (1.676 m)     Head Circumference --      Peak Flow --      Pain Score 09/21/17 1444 0     Pain Loc --      Pain Edu? --      Excl. in GC? --     Constitutional: Alert and oriented. Well appearing and in no acute distress. Eyes: Conjunctivae are normal.  Head: Atraumatic. Nose: No congestion/rhinnorhea. Mouth/Throat: Mucous membranes  are moist.   Neck: Normal range of motion.  Cardiovascular: Normal rate, regular rhythm. Grossly normal heart sounds.  Good peripheral circulation.  Chest wall nontender. Respiratory: Normal respiratory effort.  No retractions. Lungs CTAB. Gastrointestinal: No distention.  Genitourinary: No CVA tenderness. Musculoskeletal: No lower extremity edema.  Extremities warm and well perfused.  No calf or popliteal swelling or tenderness. Neurologic:  Normal speech and language. No gross focal neurologic deficits are appreciated.  Skin:  Skin is warm and dry. No rash noted. Psychiatric: Mood and affect are normal. Speech and behavior are  normal.  ____________________________________________   LABS (all labs ordered are listed, but only abnormal results are displayed)  Labs Reviewed  CBC - Abnormal; Notable for the following components:      Result Value   RDW 14.8 (*)    All other components within normal limits  BASIC METABOLIC PANEL  TROPONIN I  POC URINE PREG, ED   ____________________________________________  EKG  ED ECG REPORT I, Dionne BucySebastian Mishaal Lansdale, the attending physician, personally viewed and interpreted this ECG.  Date: 09/21/2017 EKG Time: 1450 Rate: 81 Rhythm: normal sinus rhythm QRS Axis: normal Intervals: normal ST/T Wave abnormalities: normal Narrative Interpretation: no evidence of acute ischemia  ____________________________________________  RADIOLOGY  Chest x-ray: No focal infiltrate or other acute findings.  ____________________________________________   PROCEDURES  Procedure(s) performed: No    Critical Care performed: No ____________________________________________   INITIAL IMPRESSION / ASSESSMENT AND PLAN / ED COURSE  Pertinent labs & imaging results that were available during my care of the patient were reviewed by me and considered in my medical decision making (see chart for details).  21 year old female with past medical history as noted above presents with very atypical and intermittent brief episodes of chest pain over the last week.  No pain present currently.  On exam, vital signs are normal, the patient is extremely well-appearing, and the remainder of the exam is unremarkable.  Past medical records reviewed in Epic and are noncontributory.  Presentation is consistent with muscle spasm, nerve pain, or other benign etiology.  There is no evidence of ACS or other concerning cause such as PE, and patient has no risk factors for these.  Given the negative EKG, negative troponin and other labs, and the lack of pain currently, as well as patient's age and lack of  risk factors, there is no indication for further ED observation or workup.  Patient feels reassured and feels comfortable with going home.  I will prescribe a muscle relaxant and NSAID in case patient has a more prolonged episode of the pain.  Return precautions given, and the patient expressed understanding.      ____________________________________________   FINAL CLINICAL IMPRESSION(S) / ED DIAGNOSES  Final diagnoses:  Atypical chest pain      NEW MEDICATIONS STARTED DURING THIS VISIT:  This SmartLink is deprecated. Use AVSMEDLIST instead to display the medication list for a patient.   Note:  This document was prepared using Dragon voice recognition software and may include unintentional dictation errors.    Dionne BucySiadecki, Dorena Dorfman, MD 09/21/17 [phone]

## 2018-02-02 ENCOUNTER — Encounter: Payer: Self-pay | Admitting: Emergency Medicine

## 2018-02-02 ENCOUNTER — Emergency Department
Admission: EM | Admit: 2018-02-02 | Discharge: 2018-02-02 | Disposition: A | Discharge status: Home / Self Care | Payer: Self-pay | Attending: Emergency Medicine | Admitting: Emergency Medicine

## 2018-02-02 DIAGNOSIS — Y939 Activity, unspecified: Secondary | ICD-10-CM | POA: Insufficient documentation

## 2018-02-02 DIAGNOSIS — Z91010 Allergy to peanuts: Secondary | ICD-10-CM | POA: Insufficient documentation

## 2018-02-02 DIAGNOSIS — Y999 Unspecified external cause status: Secondary | ICD-10-CM | POA: Insufficient documentation

## 2018-02-02 DIAGNOSIS — Z87891 Personal history of nicotine dependence: Secondary | ICD-10-CM | POA: Insufficient documentation

## 2018-02-02 DIAGNOSIS — Z23 Encounter for immunization: Secondary | ICD-10-CM | POA: Insufficient documentation

## 2018-02-02 DIAGNOSIS — J45909 Unspecified asthma, uncomplicated: Secondary | ICD-10-CM | POA: Insufficient documentation

## 2018-02-02 DIAGNOSIS — S91332A Puncture wound without foreign body, left foot, initial encounter: Secondary | ICD-10-CM | POA: Insufficient documentation

## 2018-02-02 DIAGNOSIS — W450XXA Nail entering through skin, initial encounter: Secondary | ICD-10-CM | POA: Insufficient documentation

## 2018-02-02 DIAGNOSIS — Y929 Unspecified place or not applicable: Secondary | ICD-10-CM | POA: Insufficient documentation

## 2018-02-02 MED ORDER — TETANUS-DIPHTH-ACELL PERTUSSIS 5-2.5-18.5 LF-MCG/0.5 IM SUSP
0.5000 mL | Freq: Once | INTRAMUSCULAR | Status: AC
  Administered 2018-02-02: 0.5 mL via INTRAMUSCULAR
  Filled 2018-02-02: qty 0.5

## 2018-02-02 MED ORDER — CEPHALEXIN 500 MG PO CAPS: 500.0000 mg | ORAL_CAPSULE | Freq: Four times a day (QID) | ORAL | 0 refills | Status: AC

## 2018-02-02 NOTE — ED Provider Notes (Signed)
[facility] Emergency Department Provider Note   ____________________________________________   First MD Initiated Contact with Patient 02/02/18 [phone]     (approximate)  I have reviewed the triage vital signs and the nursing notes.   HISTORY  Chief Complaint Puncture Wound   HPI Leslie Banks is a 22 y.o. female is here with complaint of puncture wound to her left foot.  Patient states she stepped onto nails while she was barefooted yesterday.  She cleaned it with peroxide yesterday this morning it is tender and sore at that area.  Patient also does not recall her last tetanus shot.  Currently she rates pain as 8 out of 10.   Past Medical History:  Diagnosis Date  . Asthma   . Scoliosis     There are no active problems to display for this patient.   History reviewed. No pertinent surgical history.  Prior to Admission medications   Medication Sig Start Date End Date Taking? Authorizing Provider  albuterol (PROVENTIL HFA;VENTOLIN HFA) 108 (90 Base) MCG/ACT inhaler Inhale 1-2 puffs into the lungs every 6 (six) hours as needed for wheezing or shortness of breath. 04/21/16   Hagler, Jami L, PA-C  cephALEXin (KEFLEX) 500 MG capsule Take 1 capsule (500 mg total) by mouth 4 (four) times daily. 02/02/18   Tommi Rumps, PA-C    Allergies Peanut-containing drug products  No family history on file.  Social History Social History   Tobacco Use  . Smoking status: Former Games developer  . Smokeless tobacco: Never Used  Substance Use Topics  . Alcohol use: No  . Drug use: No    Review of Systems Constitutional: No fever/chills Cardiovascular: Denies chest pain. Respiratory: Denies shortness of breath. Musculoskeletal: Negative for back pain. Skin: Positive for puncture wound. Neurological: Negative for  focal weakness or numbness. ____________________________________________   PHYSICAL EXAM:  VITAL SIGNS: ED Triage Vitals  Enc Vitals Group   BP 02/02/18 0726 111/70     Pulse Rate 02/02/18 0726 99     Resp 02/02/18 0726 20     Temp 02/02/18 0726 98.4 F (36.9 C)     Temp Source 02/02/18 0726 Oral     SpO2 --      Weight 02/02/18 0724 130 lb (59 kg)     Height 02/02/18 0724  (1.626 m)     Head Circumference --      Peak Flow --      Pain Score 02/02/18 0724 8     Pain Loc --      Pain Edu? --      Excl. in GC? --    Constitutional: Alert and oriented. Well appearing and in no acute distress. Eyes: Conjunctivae are normal. PERRL. EOMI. Head: Atraumatic. Neck: No stridor.   Cardiovascular: Normal rate, regular rhythm. Grossly normal heart sounds.  Good peripheral circulation. Respiratory: Normal respiratory effort.  No retractions. Lungs CTAB. Musculoskeletal: Left foot with 2 individual puncture wounds to the plantar aspect.  Soft tissue swelling and tenderness.  No active drainage is noted.  Motor sensory function intact distal to her injury.  Capillary refill is less than 3 seconds. Neurologic:  Normal speech and language. No gross focal neurologic deficits are appreciated.  Skin:  Skin is warm, dry and intact.  As noted above. Psychiatric: Mood and affect are normal. Speech and behavior are normal.  ____________________________________________   LABS (all labs ordered are listed, but only abnormal results are displayed)  Labs Reviewed - No data to  display  PROCEDURES  Procedure(s) performed: None  Procedures  Critical Care performed: No  ____________________________________________   INITIAL IMPRESSION / ASSESSMENT AND PLAN / ED COURSE  As part of my medical decision making, I reviewed the following data within the electronic MEDICAL RECORD NUMBER Notes from prior ED visits and [place] Controlled Substance Database  Patient was given a tetanus booster while in the department.  She was discharged with instructions to soak her foot and's warm soapy water for the next 2 days.  She is also given a prescription for  Keflex 500 mg 4 times daily for 7 days.  She is to follow-up with [facility] if any continued problems.  ____________________________________________   FINAL CLINICAL IMPRESSION(S) / ED DIAGNOSES  Final diagnoses:  Puncture wound of left foot, initial encounter     ED Discharge Orders        Ordered    cephALEXin (KEFLEX) 500 MG capsule  4 times daily     02/02/18 0745       Note:  This document was prepared using Dragon voice recognition software and may include unintentional dictation errors.    Tommi Rumps, PA-C 02/02/18 1423    Jene Every, MD 02/02/18 [phone]

## 2018-02-02 NOTE — ED Triage Notes (Signed)
Pt states her tetanus is not up to date.

## 2018-02-02 NOTE — ED Notes (Signed)
Pt sitting in bed resting with no complaints, tetanus shot given per order, explained to pt we will discharge her in about 20 min post shot. Pt verbalized understanding.

## 2018-02-02 NOTE — Discharge Instructions (Addendum)
Psych your foot in warm soapy water twice a day for the next 2 days.  Begin taking antibiotics 4 times a day for the next 7 days.  You may also take Tylenol or ibuprofen as needed for foot pain.  You were given a tetanus shot today.  Follow-up with your primary care provider if any continued problems with your foot.

## 2018-02-02 NOTE — ED Triage Notes (Signed)
Pt reports stepped on 2 nails yesterday and cleaned it with peroxide and now left foot painful and swollen.

## 2018-02-19 ENCOUNTER — Other Ambulatory Visit: Payer: Self-pay

## 2018-02-19 ENCOUNTER — Emergency Department: Payer: Self-pay

## 2018-02-19 ENCOUNTER — Encounter: Payer: Self-pay | Admitting: Emergency Medicine

## 2018-02-19 ENCOUNTER — Emergency Department
Admission: EM | Admit: 2018-02-19 | Discharge: 2018-02-19 | Disposition: A | Discharge status: Home / Self Care | Payer: Self-pay | Attending: Emergency Medicine | Admitting: Emergency Medicine

## 2018-02-19 DIAGNOSIS — Z5321 Procedure and treatment not carried out due to patient leaving prior to being seen by health care provider: Secondary | ICD-10-CM | POA: Insufficient documentation

## 2018-02-19 DIAGNOSIS — M549 Dorsalgia, unspecified: Secondary | ICD-10-CM | POA: Insufficient documentation

## 2018-02-19 LAB — POC URINE PREG, ED: Preg Test, Ur: NEGATIVE

## 2018-02-19 NOTE — ED Notes (Addendum)
Pt not found in room, walked out AMA before x-ray and assessment by provider done.  No AMA paperwork able to be signed.

## 2018-02-19 NOTE — ED Provider Notes (Signed)
Patient presented to the emergency department via EMS for back pain.  Prior to being assessed by myself, patient had been in and out of her room multiple times for snacks, telephone, requesting how long till provider assesses her.  Patient was in the emergency department less than 30 minutes before patient became exceedingly upset, cussing at me loudly because she had not been seen.  Patient reports "this hospital fucking sucks.  You are garbage.  You did not do a God damn thing about my pain.  You will hear about this, I will have my lawyer come up here to tell you how much you fucking suck."  Patient was advised that she may return to the room and would be seen in order, however patient became even more upset, cursing more, and more loudly before leaving.  Patient has been mobile in and out of her room multiple times.  Patient did not appear in any distress while she was yelling and cursing at me prior to leaving.  Patient was dilatory with no limp on her way out of the emergency department.   Patient was not assessed by me prior to her leaving.   Lanette Hampshire 02/19/18 2011    Sharyn Creamer, MD 02/20/18 Rich Fuchs

## 2018-02-19 NOTE — ED Triage Notes (Addendum)
Pt to ED via [facility] from work c/o mid back pain.  States was lifting a couple boxes weighing 30lbs in total and twisted and felt pulling in her back with burning pain, denies numbness or tingling, (+) sensation and pedal pulses.  Pt states does not want to file WC.  Patient hopped from EMS stretcher to ER bed.

## 2018-02-19 NOTE — ED Notes (Signed)
EMS from work , lifting objects , complaining of back pain.

## 2018-08-06 ENCOUNTER — Emergency Department
Admission: EM | Admit: 2018-08-06 | Discharge: 2018-08-06 | Disposition: A | Discharge status: Home / Self Care | Payer: Medicaid Other | Attending: Emergency Medicine | Admitting: Emergency Medicine

## 2018-08-06 ENCOUNTER — Encounter: Payer: Self-pay | Admitting: Emergency Medicine

## 2018-08-06 DIAGNOSIS — M419 Scoliosis, unspecified: Secondary | ICD-10-CM | POA: Insufficient documentation

## 2018-08-06 DIAGNOSIS — A749 Chlamydial infection, unspecified: Secondary | ICD-10-CM | POA: Insufficient documentation

## 2018-08-06 DIAGNOSIS — J45909 Unspecified asthma, uncomplicated: Secondary | ICD-10-CM | POA: Insufficient documentation

## 2018-08-06 DIAGNOSIS — F17200 Nicotine dependence, unspecified, uncomplicated: Secondary | ICD-10-CM | POA: Insufficient documentation

## 2018-08-06 DIAGNOSIS — Z91010 Allergy to peanuts: Secondary | ICD-10-CM | POA: Insufficient documentation

## 2018-08-06 DIAGNOSIS — Z202 Contact with and (suspected) exposure to infections with a predominantly sexual mode of transmission: Secondary | ICD-10-CM | POA: Insufficient documentation

## 2018-08-06 LAB — WET PREP, GENITAL
Clue Cells Wet Prep HPF POC: NONE SEEN
SPERM: NONE SEEN
TRICH WET PREP: NONE SEEN
WBC WET PREP: NONE SEEN
YEAST WET PREP: NONE SEEN

## 2018-08-06 LAB — URINALYSIS, COMPLETE (UACMP) WITH MICROSCOPIC
BACTERIA UA: NONE SEEN
BILIRUBIN URINE: NEGATIVE
Glucose, UA: NEGATIVE mg/dL
HGB URINE DIPSTICK: NEGATIVE
Ketones, ur: 5 mg/dL — AB
LEUKOCYTES UA: NEGATIVE
NITRITE: NEGATIVE
PH: 5 (ref 5.0–8.0)
Protein, ur: 30 mg/dL — AB
SPECIFIC GRAVITY, URINE: 1.025 (ref 1.005–1.030)

## 2018-08-06 LAB — CHLAMYDIA/NGC RT PCR (ARMC ONLY)
Chlamydia Tr: DETECTED — AB
N GONORRHOEAE: NOT DETECTED

## 2018-08-06 LAB — POCT PREGNANCY, URINE: PREG TEST UR: NEGATIVE

## 2018-08-06 MED ORDER — AZITHROMYCIN 500 MG PO TABS
1000.0000 mg | ORAL_TABLET | Freq: Once | ORAL | Status: AC
  Administered 2018-08-06: 1000 mg via ORAL
  Filled 2018-08-06: qty 2

## 2018-08-06 MED ORDER — CEFTRIAXONE SODIUM 250 MG IJ SOLR
250.0000 mg | Freq: Once | INTRAMUSCULAR | Status: AC
  Administered 2018-08-06: 250 mg via INTRAMUSCULAR
  Filled 2018-08-06: qty 250

## 2018-08-06 MED ORDER — LIDOCAINE HCL (PF) 1 % IJ SOLN
INTRAMUSCULAR | Status: AC
  Administered 2018-08-06: 2.1 mL
  Filled 2018-08-06: qty 5

## 2018-08-06 NOTE — ED Notes (Signed)
No reaction to medication.
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
## 2018-08-06 NOTE — ED Provider Notes (Signed)
[facility] Emergency Department Provider Note  ____________________________________________  Time seen: Approximately 11:47 AM  I have reviewed the triage vital signs and the nursing notes.   HISTORY  Chief Complaint Exposure to STD   HPI Leslie Banks is a 22 y.o. female who presents requesting STD testing.  Patient reports that she received a phone call from 4 different girls saying that her boyfriend was cheating on her with them. Patient has had a mild vaginal discharge for 1 week and she wants to get tested for STD. No prior h/o STD. No dysuria. She has had mild irritation in her vagina. No abdominal pain, nausea, vomiting, fever or chills.   Past Medical History:  Diagnosis Date  . Asthma   . Scoliosis     There are no active problems to display for this patient.   History reviewed. No pertinent surgical history.  Prior to Admission medications   Medication Sig Start Date End Date Taking? Authorizing Provider  albuterol (PROVENTIL HFA;VENTOLIN HFA) 108 (90 Base) MCG/ACT inhaler Inhale 1-2 puffs into the lungs every 6 (six) hours as needed for wheezing or shortness of breath. 04/21/16   Hagler, Jami L, PA-C  cephALEXin (KEFLEX) 500 MG capsule Take 1 capsule (500 mg total) by mouth 4 (four) times daily. 02/02/18   Tommi Rumps, PA-C    Allergies Peanut-containing drug products  No family history on file.  Social History Social History   Tobacco Use  . Smoking status: Current Every Day Smoker    Packs/day: 0.25    Types: Cigarettes  . Smokeless tobacco: Never Used  Substance Use Topics  . Alcohol use: No    Comment: occ.  . Drug use: Yes    Types: Marijuana    Review of Systems  Constitutional: Negative for fever. Eyes: Negative for visual changes. ENT: Negative for sore throat. Neck: No neck pain  Cardiovascular: Negative for chest pain. Respiratory: Negative for shortness of breath. Gastrointestinal: Negative for  abdominal pain, vomiting or diarrhea. Genitourinary: Negative for dysuria. + vaginal discharge Musculoskeletal: Negative for back pain. Skin: Negative for rash. Neurological: Negative for headaches, weakness or numbness. Psych: No SI or HI  ____________________________________________   PHYSICAL EXAM:  VITAL SIGNS: ED Triage Vitals  Enc Vitals Group     BP 08/06/18 1105 114/78     Pulse Rate 08/06/18 1105 98     Resp 08/06/18 1105 18     Temp 08/06/18 1105 98.6 F (37 C)     Temp Source 08/06/18 1105 Oral     SpO2 08/06/18 1105 100 %     Weight 08/06/18 1107 130 lb (59 kg)     Height 08/06/18 1107 5\' 4"  (1.626 m)     Head Circumference --      Peak Flow --      Pain Score 08/06/18 1107 5     Pain Loc --      Pain Edu? --      Excl. in GC? --     Constitutional: Alert and oriented. Well appearing and in no apparent distress. HEENT:      Head: Normocephalic and atraumatic.         Eyes: Conjunctivae are normal. Sclera is non-icteric.       Mouth/Throat: Mucous membranes are moist.       Neck: Supple with no signs of meningismus. Cardiovascular: Regular rate and rhythm. No murmurs, gallops, or rubs. 2+ symmetrical distal pulses are present in all extremities. No JVD. Respiratory:  Normal respiratory effort. Lungs are clear to auscultation bilaterally. No wheezes, crackles, or rhonchi.  Gastrointestinal: Soft, non tender, and non distended with positive bowel sounds. No rebound or guarding. Pelvic exam: Normal external genitalia, no rashes or lesions. Normal cervical mucus. Os closed. No cervical motion tenderness.  No uterine or adnexal tenderness.   Musculoskeletal: Nontender with normal range of motion in all extremities. No edema, cyanosis, or erythema of extremities. Neurologic: Normal speech and language. Face is symmetric. Moving all extremities. No gross focal neurologic deficits are appreciated. Skin: Skin is warm, dry and intact. No rash noted. Psychiatric: Mood and  affect are normal. Speech and behavior are normal.  ____________________________________________   LABS (all labs ordered are listed, but only abnormal results are displayed)  Labs Reviewed  CHLAMYDIA/NGC RT PCR ([facility] ONLY) - Abnormal; Notable for the following components:      Result Value   Chlamydia Tr DETECTED (*)    All other components within normal limits  URINALYSIS, COMPLETE (UACMP) WITH MICROSCOPIC - Abnormal; Notable for the following components:   Color, Urine YELLOW (*)    APPearance HAZY (*)    Ketones, ur 5 (*)    Protein, ur 30 (*)    All other components within normal limits  WET PREP, GENITAL  POCT PREGNANCY, URINE   ____________________________________________  EKG  none  ____________________________________________  RADIOLOGY  none  ____________________________________________   PROCEDURES  Procedure(s) performed: None Procedures Critical Care performed:  None ____________________________________________   INITIAL IMPRESSION / ASSESSMENT AND PLAN / ED COURSE  22 y.o. female who presents requesting STD testing after finding out that her boyfriend is cheating on her.  Patient underwent pelvic exam which did not show any CMT or adnexal tenderness, there is mild physiological discharge seen on exam. Wet prep, Gc, chlamydia sent. Patient requesting to receive prophylactic treatment. She was given IM rocephin and azithromycin. Discussed safe sex practices with patient. Pregnancy test negative. Discussed HIV and syphilis testing however due to recent exposure I recommended testing it today and if negative to have it re-tested by PCP in 2 weeks. Patient has an appointment with the health department in 2 weeks and has opted to undergo testing once in 2 weeks.     _________________________ 5:41 PM on 08/06/2018 -----------------------------------------  Follow-up results of STD after patient has been discharged.  Chlamydia positive.  Patient  treated for it.  I called patient to let her know that the test was positive.  As part of my medical decision making, I reviewed the following data within the electronic MEDICAL RECORD NUMBER Nursing notes reviewed and incorporated, Labs reviewed , Old chart reviewed, Notes from prior ED visits and [place] Controlled Substance Database    Pertinent labs & imaging results that were available during my care of the patient were reviewed by me and considered in my medical decision making (see chart for details).    ____________________________________________   FINAL CLINICAL IMPRESSION(S) / ED DIAGNOSES  Final diagnoses:  STD exposure  Chlamydia      NEW MEDICATIONS STARTED DURING THIS VISIT:  ED Discharge Orders    None       Note:  This document was prepared using Dragon voice recognition software and may include unintentional dictation errors.    Nita SickleVeronese, [place], MD 08/06/18 [phone]

## 2018-08-06 NOTE — ED Triage Notes (Signed)
Patient states reports pelvic discomfort x 1 week.  Patient states, "I thought my menstrual cycle was going on, but then I found out my boyfriend was sleeping with a lot of other girls."  Patient reports today pain has increased.  Patient denies any vaginal discharge.  Patient is in no obvious distress at this time.

## 2018-08-06 NOTE — Discharge Instructions (Addendum)
You were treated for gonorrhea and chlamydia. Your partners need to be treated as well to prevent a re-infection. Follow up with your doctor or the health department in 1 week for HIV and syphilis testing.

## 2019-07-20 IMAGING — CR DG CHEST 2V
2 series · 2 of 2 positions shown · non-contrast
Comparison: 04/21/2016

CLINICAL DATA: Left upper anterior chest pain intermittently for
over 1 week, smoker.

EXAM:
CHEST  2 VIEW

[chest pa]
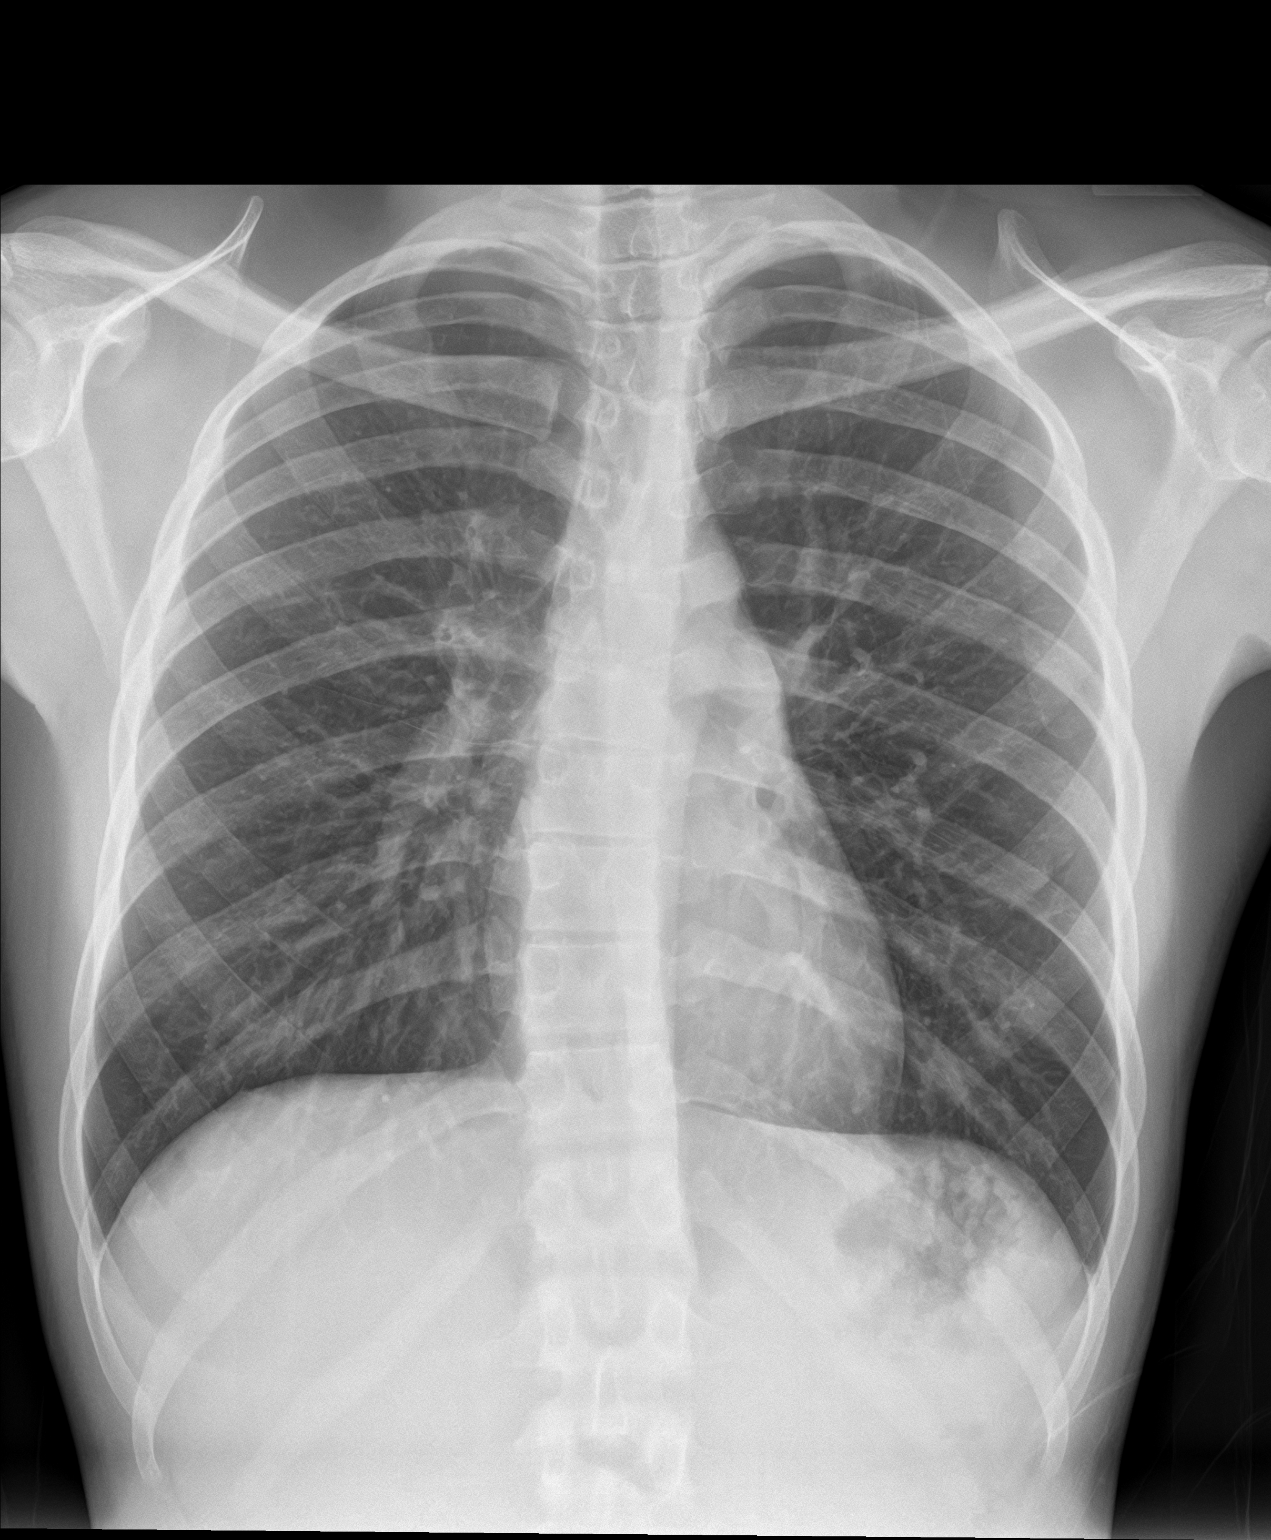

[chest lat]
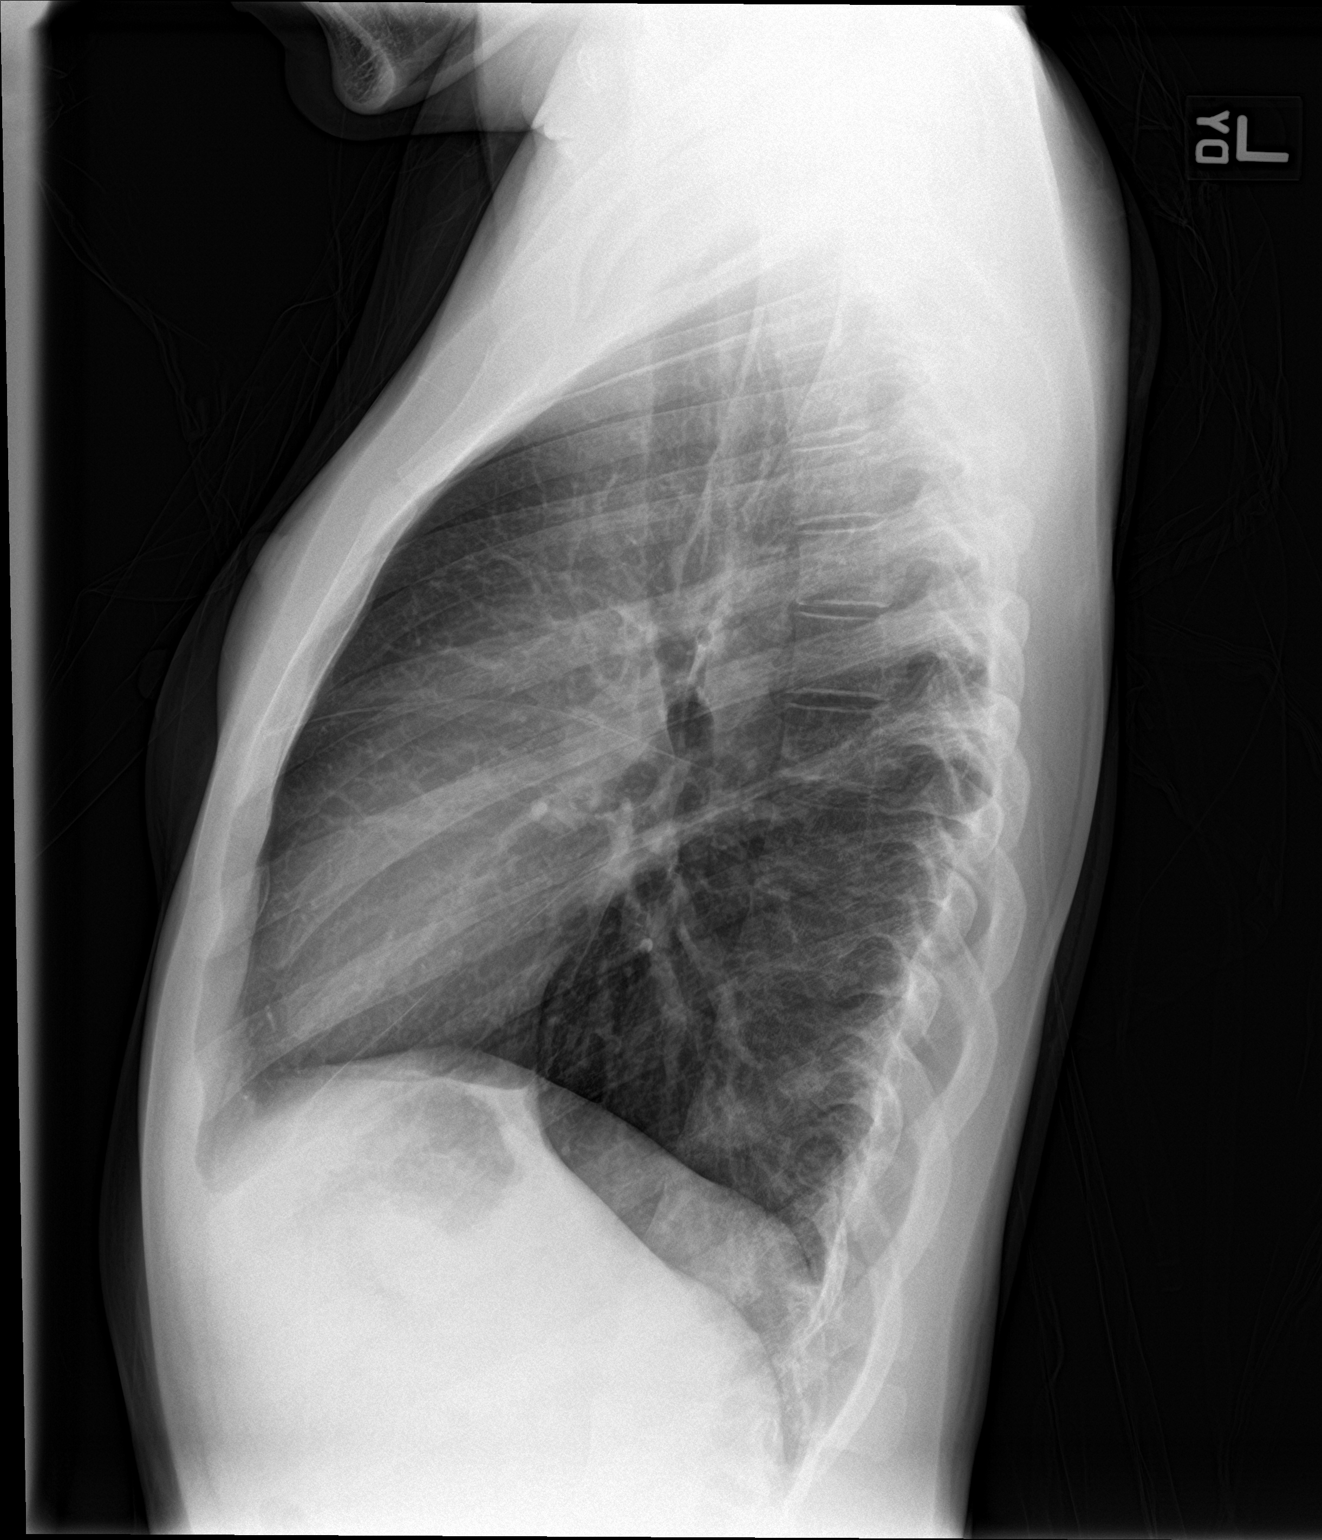

[2 of 2 positions shown; findings below may reference images not displayed]

FINDINGS: Midline trachea. Normal heart size and mediastinal contours. No
pleural effusion or pneumothorax. S-shaped thoracolumbar spine
curvature. Clear lungs.
IMPRESSION: No active cardiopulmonary disease.
